# Patient Record
Sex: Female | Born: 1960 | Race: Black or African American | Hispanic: No | Marital: Single | Smoking: Former smoker
Health system: Southern US, Community
[De-identification: ages and names within clinical notes are randomized; demographics above are authoritative.]

## PROBLEM LIST (undated history)

## (undated) DIAGNOSIS — M51369 Other intervertebral disc degeneration, lumbar region without mention of lumbar back pain or lower extremity pain: Secondary | ICD-10-CM

## (undated) DIAGNOSIS — K219 Gastro-esophageal reflux disease without esophagitis: Secondary | ICD-10-CM

## (undated) DIAGNOSIS — G5603 Carpal tunnel syndrome, bilateral upper limbs: Secondary | ICD-10-CM

## (undated) DIAGNOSIS — E669 Obesity, unspecified: Secondary | ICD-10-CM

## (undated) DIAGNOSIS — I10 Essential (primary) hypertension: Secondary | ICD-10-CM

## (undated) DIAGNOSIS — M5030 Other cervical disc degeneration, unspecified cervical region: Secondary | ICD-10-CM

## (undated) DIAGNOSIS — E11319 Type 2 diabetes mellitus with unspecified diabetic retinopathy without macular edema: Secondary | ICD-10-CM

## (undated) DIAGNOSIS — J45909 Unspecified asthma, uncomplicated: Secondary | ICD-10-CM

## (undated) DIAGNOSIS — J302 Other seasonal allergic rhinitis: Secondary | ICD-10-CM

## (undated) DIAGNOSIS — F329 Major depressive disorder, single episode, unspecified: Secondary | ICD-10-CM

## (undated) DIAGNOSIS — Z972 Presence of dental prosthetic device (complete) (partial): Secondary | ICD-10-CM

## (undated) DIAGNOSIS — M170 Bilateral primary osteoarthritis of knee: Secondary | ICD-10-CM

## (undated) DIAGNOSIS — Z8619 Personal history of other infectious and parasitic diseases: Secondary | ICD-10-CM

## (undated) DIAGNOSIS — E785 Hyperlipidemia, unspecified: Secondary | ICD-10-CM

## (undated) DIAGNOSIS — M5136 Other intervertebral disc degeneration, lumbar region: Secondary | ICD-10-CM

## (undated) DIAGNOSIS — F32A Depression, unspecified: Secondary | ICD-10-CM

## (undated) HISTORY — PX: COLONOSCOPY: SHX174

---

## 1992-10-18 HISTORY — PX: NOSE SURGERY: SHX723

## 1999-10-19 HISTORY — PX: FLEXIBLE SIGMOIDOSCOPY: SHX1649

## 2004-10-29 ENCOUNTER — Ambulatory Visit: Payer: Self-pay | Admitting: Internal Medicine

## 2006-12-05 ENCOUNTER — Ambulatory Visit: Payer: Self-pay | Admitting: Family Medicine

## 2007-07-27 ENCOUNTER — Ambulatory Visit: Payer: Self-pay | Admitting: Nurse Practitioner

## 2007-08-15 ENCOUNTER — Encounter: Payer: Self-pay | Admitting: Unknown Physician Specialty

## 2007-08-19 ENCOUNTER — Encounter: Payer: Self-pay | Admitting: Unknown Physician Specialty

## 2007-09-18 ENCOUNTER — Encounter: Payer: Self-pay | Admitting: Unknown Physician Specialty

## 2007-12-14 ENCOUNTER — Ambulatory Visit: Payer: Self-pay | Admitting: Family Medicine

## 2008-12-31 ENCOUNTER — Ambulatory Visit: Payer: Self-pay | Admitting: Internal Medicine

## 2010-01-02 ENCOUNTER — Ambulatory Visit: Payer: Self-pay | Admitting: Internal Medicine

## 2011-01-28 ENCOUNTER — Ambulatory Visit: Payer: Self-pay | Admitting: Internal Medicine

## 2011-02-24 ENCOUNTER — Ambulatory Visit: Payer: Self-pay | Admitting: Internal Medicine

## 2012-02-01 ENCOUNTER — Ambulatory Visit: Payer: Self-pay | Admitting: Internal Medicine

## 2012-05-19 ENCOUNTER — Ambulatory Visit: Payer: Self-pay | Admitting: Unknown Physician Specialty

## 2013-02-02 ENCOUNTER — Ambulatory Visit: Payer: Self-pay | Admitting: Internal Medicine

## 2013-10-18 HISTORY — PX: OTHER SURGICAL HISTORY: SHX169

## 2014-02-14 ENCOUNTER — Ambulatory Visit: Payer: Self-pay | Admitting: Internal Medicine

## 2014-03-27 ENCOUNTER — Ambulatory Visit: Payer: Self-pay | Admitting: Family Medicine

## 2014-04-01 ENCOUNTER — Ambulatory Visit: Payer: Self-pay | Admitting: Physician Assistant

## 2014-05-10 ENCOUNTER — Ambulatory Visit: Payer: Self-pay | Admitting: Unknown Physician Specialty

## 2014-08-05 DIAGNOSIS — J302 Other seasonal allergic rhinitis: Secondary | ICD-10-CM | POA: Insufficient documentation

## 2014-08-05 DIAGNOSIS — I152 Hypertension secondary to endocrine disorders: Secondary | ICD-10-CM | POA: Insufficient documentation

## 2014-08-05 DIAGNOSIS — E11319 Type 2 diabetes mellitus with unspecified diabetic retinopathy without macular edema: Secondary | ICD-10-CM | POA: Insufficient documentation

## 2014-08-05 DIAGNOSIS — Z79899 Other long term (current) drug therapy: Secondary | ICD-10-CM | POA: Insufficient documentation

## 2014-08-05 DIAGNOSIS — E1169 Type 2 diabetes mellitus with other specified complication: Secondary | ICD-10-CM | POA: Insufficient documentation

## 2014-08-05 DIAGNOSIS — F3341 Major depressive disorder, recurrent, in partial remission: Secondary | ICD-10-CM | POA: Insufficient documentation

## 2014-08-05 DIAGNOSIS — K219 Gastro-esophageal reflux disease without esophagitis: Secondary | ICD-10-CM | POA: Insufficient documentation

## 2014-08-06 DIAGNOSIS — M170 Bilateral primary osteoarthritis of knee: Secondary | ICD-10-CM | POA: Insufficient documentation

## 2014-08-06 DIAGNOSIS — G4700 Insomnia, unspecified: Secondary | ICD-10-CM | POA: Insufficient documentation

## 2014-08-23 DIAGNOSIS — M5136 Other intervertebral disc degeneration, lumbar region: Secondary | ICD-10-CM | POA: Insufficient documentation

## 2014-08-23 DIAGNOSIS — M51369 Other intervertebral disc degeneration, lumbar region without mention of lumbar back pain or lower extremity pain: Secondary | ICD-10-CM | POA: Insufficient documentation

## 2014-12-04 DIAGNOSIS — G5603 Carpal tunnel syndrome, bilateral upper limbs: Secondary | ICD-10-CM | POA: Insufficient documentation

## 2015-02-20 ENCOUNTER — Other Ambulatory Visit: Payer: Self-pay | Admitting: Internal Medicine

## 2015-02-20 DIAGNOSIS — Z1239 Encounter for other screening for malignant neoplasm of breast: Secondary | ICD-10-CM

## 2015-02-25 ENCOUNTER — Ambulatory Visit: Admission: RE | Admit: 2015-02-25 | Payer: Self-pay | Source: Ambulatory Visit

## 2015-05-29 ENCOUNTER — Ambulatory Visit
Admission: RE | Admit: 2015-05-29 | Discharge: 2015-05-29 | Disposition: A | Discharge status: Home / Self Care | Payer: 59 | Source: Ambulatory Visit | Attending: Internal Medicine | Admitting: Internal Medicine

## 2015-05-29 DIAGNOSIS — Z1239 Encounter for other screening for malignant neoplasm of breast: Secondary | ICD-10-CM

## 2015-05-29 DIAGNOSIS — Z1231 Encounter for screening mammogram for malignant neoplasm of breast: Secondary | ICD-10-CM | POA: Insufficient documentation

## 2015-07-01 ENCOUNTER — Other Ambulatory Visit: Payer: Self-pay | Admitting: Orthopedic Surgery

## 2015-07-01 DIAGNOSIS — M5412 Radiculopathy, cervical region: Secondary | ICD-10-CM

## 2015-07-08 ENCOUNTER — Ambulatory Visit
Admission: RE | Admit: 2015-07-08 | Discharge: 2015-07-08 | Disposition: A | Discharge status: Home / Self Care | Payer: 59 | Source: Ambulatory Visit | Attending: Orthopedic Surgery | Admitting: Orthopedic Surgery

## 2015-07-08 DIAGNOSIS — M5412 Radiculopathy, cervical region: Secondary | ICD-10-CM | POA: Diagnosis present

## 2015-07-08 DIAGNOSIS — M2578 Osteophyte, vertebrae: Secondary | ICD-10-CM | POA: Diagnosis not present

## 2016-04-09 ENCOUNTER — Ambulatory Visit: Payer: 59 | Admitting: Anesthesiology

## 2016-04-09 ENCOUNTER — Encounter
Admission: RE | Disposition: A | Discharge status: Home / Self Care | Payer: Self-pay | Source: Ambulatory Visit | Attending: Unknown Physician Specialty

## 2016-04-09 ENCOUNTER — Ambulatory Visit
Admission: RE | Admit: 2016-04-09 | Discharge: 2016-04-09 | Disposition: A | Discharge status: Home / Self Care | Payer: 59 | Source: Ambulatory Visit | Attending: Unknown Physician Specialty | Admitting: Unknown Physician Specialty

## 2016-04-09 DIAGNOSIS — G5602 Carpal tunnel syndrome, left upper limb: Secondary | ICD-10-CM | POA: Insufficient documentation

## 2016-04-09 HISTORY — DX: Essential (primary) hypertension: I10

## 2016-04-09 HISTORY — DX: Personal history of other infectious and parasitic diseases: Z86.19

## 2016-04-09 HISTORY — DX: Other seasonal allergic rhinitis: J30.2

## 2016-04-09 HISTORY — DX: Type 2 diabetes mellitus with unspecified diabetic retinopathy without macular edema: E11.319

## 2016-04-09 HISTORY — DX: Depression, unspecified: F32.A

## 2016-04-09 HISTORY — DX: Presence of dental prosthetic device (complete) (partial): Z97.2

## 2016-04-09 HISTORY — DX: Bilateral primary osteoarthritis of knee: M17.0

## 2016-04-09 HISTORY — DX: Hyperlipidemia, unspecified: E78.5

## 2016-04-09 HISTORY — DX: Other intervertebral disc degeneration, lumbar region: M51.36

## 2016-04-09 HISTORY — DX: Other cervical disc degeneration, unspecified cervical region: M50.30

## 2016-04-09 HISTORY — PX: CARPAL TUNNEL RELEASE: SHX101

## 2016-04-09 HISTORY — DX: Obesity, unspecified: E66.9

## 2016-04-09 HISTORY — DX: Gastro-esophageal reflux disease without esophagitis: K21.9

## 2016-04-09 HISTORY — DX: Carpal tunnel syndrome, bilateral upper limbs: G56.03

## 2016-04-09 HISTORY — DX: Major depressive disorder, single episode, unspecified: F32.9

## 2016-04-09 HISTORY — DX: Other intervertebral disc degeneration, lumbar region without mention of lumbar back pain or lower extremity pain: M51.369

## 2016-04-09 LAB — GLUCOSE, CAPILLARY
Glucose-Capillary: 144 mg/dL — ABNORMAL HIGH (ref 65–99)
Glucose-Capillary: 134 mg/dL — ABNORMAL HIGH (ref 65–99)

## 2016-04-09 SURGERY — CARPAL TUNNEL RELEASE
Anesthesia: General | Site: Hand | Laterality: Left | Wound class: Clean

## 2016-04-09 MED ORDER — SCOPOLAMINE 1 MG/3DAYS TD PT72: 1.0000 | MEDICATED_PATCH | Freq: Once | TRANSDERMAL | Status: DC

## 2016-04-09 MED ORDER — FENTANYL CITRATE (PF) 100 MCG/2ML IJ SOLN
INTRAMUSCULAR | Status: DC | PRN
  Administered 2016-04-09: 100 ug via INTRAVENOUS

## 2016-04-09 MED ORDER — BUPIVACAINE HCL (PF) 0.5 % IJ SOLN
INTRAMUSCULAR | Status: DC | PRN
  Administered 2016-04-09: 7 mL

## 2016-04-09 MED ORDER — LACTATED RINGERS IV SOLN
INTRAVENOUS | Status: DC
  Administered 2016-04-09: 07:00:00 via INTRAVENOUS

## 2016-04-09 MED ORDER — PROPOFOL 10 MG/ML IV BOLUS
INTRAVENOUS | Status: DC | PRN
Start: 2016-04-09 — End: 2016-04-09
  Administered 2016-04-09: 130 mg via INTRAVENOUS

## 2016-04-09 MED ORDER — MIDAZOLAM HCL 5 MG/5ML IJ SOLN
INTRAMUSCULAR | Status: DC | PRN
  Administered 2016-04-09: 2 mg via INTRAVENOUS

## 2016-04-09 MED ORDER — FENTANYL CITRATE (PF) 100 MCG/2ML IJ SOLN: 25.0000 ug | INTRAMUSCULAR | Status: DC | PRN

## 2016-04-09 MED ORDER — NORCO 5-325 MG PO TABS: 1.0000 | ORAL_TABLET | Freq: Four times a day (QID) | ORAL | Status: DC | PRN

## 2016-04-09 MED ORDER — GLYCOPYRROLATE 0.2 MG/ML IJ SOLN
INTRAMUSCULAR | Status: DC | PRN
  Administered 2016-04-09: 0.1 mg via INTRAVENOUS

## 2016-04-09 MED ORDER — LIDOCAINE HCL (CARDIAC) 20 MG/ML IV SOLN
INTRAVENOUS | Status: DC | PRN
  Administered 2016-04-09: 40 mg via INTRATRACHEAL

## 2016-04-09 MED ORDER — ONDANSETRON HCL 4 MG/2ML IJ SOLN: 4.0000 mg | Freq: Once | INTRAMUSCULAR | Status: DC | PRN

## 2016-04-09 MED ORDER — DEXAMETHASONE SODIUM PHOSPHATE 4 MG/ML IJ SOLN
INTRAMUSCULAR | Status: DC | PRN
  Administered 2016-04-09: 4 mg via INTRAVENOUS

## 2016-04-09 MED ORDER — ONDANSETRON HCL 4 MG/2ML IJ SOLN
INTRAMUSCULAR | Status: DC | PRN
  Administered 2016-04-09: 4 mg via INTRAVENOUS

## 2016-04-09 SURGICAL SUPPLY — 30 items
BANDAGE ELASTIC 2 CLIP NS LF (GAUZE/BANDAGES/DRESSINGS) ×2 IMPLANT
BNDG ESMARK 4X12 TAN STRL LF (GAUZE/BANDAGES/DRESSINGS) ×2 IMPLANT
BNDG STRETCH 4X75 STRL LF (GAUZE/BANDAGES/DRESSINGS) ×2 IMPLANT
COVER LIGHT HANDLE FLEXIBLE (MISCELLANEOUS) ×4 IMPLANT
CUFF TOURN SGL QUICK 18 (TOURNIQUET CUFF) ×2 IMPLANT
DURAPREP 26ML APPLICATOR (WOUND CARE) ×2 IMPLANT
GAUZE SPONGE 4X4 12PLY STRL (GAUZE/BANDAGES/DRESSINGS) ×2 IMPLANT
GLOVE BIO SURGEON STRL SZ7.5 (GLOVE) ×4 IMPLANT
GLOVE BIO SURGEON STRL SZ8 (GLOVE) ×2 IMPLANT
GLOVE BIOGEL PI IND STRL 8 (GLOVE) ×1 IMPLANT
GLOVE BIOGEL PI INDICATOR 8 (GLOVE) ×1
GLOVE INDICATOR 8.0 STRL GRN (GLOVE) ×2 IMPLANT
GOWN STRL REUS W/ TWL LRG LVL3 (GOWN DISPOSABLE) ×2 IMPLANT
GOWN STRL REUS W/TWL LRG LVL3 (GOWN DISPOSABLE) ×2
KIT ROOM TURNOVER OR (KITS) ×2 IMPLANT
LOOP VESSEL RED MINI 1.3X0.9 (MISCELLANEOUS) IMPLANT
LOOPS RED MINI 1.3MMX0.9MM (MISCELLANEOUS)
NS IRRIG 500ML POUR BTL (IV SOLUTION) ×2 IMPLANT
PACK EXTREMITY ARMC (MISCELLANEOUS) ×2 IMPLANT
PAD GROUND ADULT SPLIT (MISCELLANEOUS) ×2 IMPLANT
PADDING CAST 2X4YD ST (MISCELLANEOUS) ×1
PADDING CAST BLEND 2X4 STRL (MISCELLANEOUS) ×1 IMPLANT
SOL PREP PVP 2OZ (MISCELLANEOUS) ×2
SOLUTION PREP PVP 2OZ (MISCELLANEOUS) ×1 IMPLANT
SPLINT CAST 1 STEP 3X12 (MISCELLANEOUS) ×2 IMPLANT
STOCKINETTE 4X48 STRL (DRAPES) ×2 IMPLANT
STRAP BODY AND KNEE 60X3 (MISCELLANEOUS) ×2 IMPLANT
SUT ETHILON 4-0 (SUTURE) ×2
SUT ETHILON 4-0 FS2 18XMFL BLK (SUTURE) ×2
SUTURE ETHLN 4-0 FS2 18XMF BLK (SUTURE) ×2 IMPLANT

## 2016-04-09 NOTE — Transfer of Care (Signed)
Immediate Anesthesia Transfer of Care Note  Patient: Debbie Powers  Procedure(s) Performed: Procedure(s): LEFT OPEN CARPAL TUNNEL RELEASE (Left)  Patient Location: PACU  Anesthesia Type: General LMA  Level of Consciousness: awake, alert  and patient cooperative  Airway and Oxygen Therapy: Patient Spontanous Breathing and Patient connected to supplemental oxygen  Post-op Assessment: Post-op Vital signs reviewed, Patient's Cardiovascular Status Stable, Respiratory Function Stable, Patent Airway and No signs of Nausea or vomiting  Post-op Vital Signs: Reviewed and stable  Complications: No apparent anesthesia complications

## 2016-04-09 NOTE — Anesthesia Postprocedure Evaluation (Signed)
Anesthesia Post Note  Patient: Debbie Powers  Procedure(s) Performed: Procedure(s) (LRB): LEFT OPEN CARPAL TUNNEL RELEASE (Left)  Patient location during evaluation: PACU Anesthesia Type: General Level of consciousness: awake and alert and oriented Pain management: satisfactory to patient Vital Signs Assessment: post-procedure vital signs reviewed and stable Respiratory status: spontaneous breathing, nonlabored ventilation and respiratory function stable Cardiovascular status: blood pressure returned to baseline and stable Postop Assessment: Adequate PO intake and No signs of nausea or vomiting Anesthetic complications: no    Raliegh Ip

## 2016-04-09 NOTE — Op Note (Signed)
DATE OF SURGERY:  04/09/2016  PATIENT NAME:  Debbie Powers   DOB: [birth year]  MRN: [id number]  PRE-OPERATIVE DIAGNOSIS: Left carpal tunnel syndrome  POST-OPERATIVE DIAGNOSIS:  Same  PROCEDURE: Left carpal tunnel release  SURGEON: Dr. Leanor Kail, [place] Bonito. M.D.  ANESTHESIA: Gen.   INDICATIONS FOR SURGERY: [name] is a 55 y.o. year old female with a long history of numbness and paresthesias in the left hand. Nerve conduction studies demonstrated findings consistent with moderately severe  median nerve compression.The patient had not seen any significant improvement despite conservative nonsurgical intervention. After discussion of the risks and benefits of surgical intervention, the patient expressed understanding of the risks benefits and agreed with plans for carpal tunnel release. Incidentally, the patient had an open carpal tunnel release on the right in the recent past.  PROCEDURE IN DETAIL: The patient was taken the operating room where satisfactory general anesthesia was achieved. A tourniquet was placed on the patient's left upper arm.The left hand and arm were prepped  and draped in the usual sterile fashion. A "time-out" was performed as per usual protocol. The hand and forearm were exsanguinated using an Esmarch and the tourniquet was inflated to 250 mmHg.  An incision was made just ulnar to the thenar palmar crease. Dissection was carried down through the palmar fascia to the transverse carpal ligament. The transverse carpal ligament was sharply incised, taking care to protect the underlying structures within the carpal tunnel. Complete release of the transverse carpal ligament was achieved. There was no evidence of a mass or proliferative synovitis within the carpal tunnel. The wound was irrigated with saline. The tourniquet was released at this time. It had been up for about 14 minutes. Bleeding was controlled with digital pressure and coagulation cautery. I did inject  the subcutaneous tissue of the wound with about 5 cc of 0.5% Marcaine without epinephrine. The skin was then re-approximated with interrupted sutures of #4-0 nylon. A sterile dressing was applied followed by application of a volar splint.  The patient was awakened and transferred to her stretcher bed.  The patient tolerated the procedure well and was transported to the PACU in stable condition. Blood loss was negligible.  Dr. Mariel Kansky. M.D.

## 2016-04-09 NOTE — Anesthesia Procedure Notes (Signed)
Procedure Name: LMA Insertion Date/Time: 04/09/2016 7:42 AM Performed by: Mayme Genta Pre-anesthesia Checklist: Patient identified, Emergency Drugs available, Suction available, Timeout performed and Patient being monitored Patient Re-evaluated:Patient Re-evaluated prior to inductionOxygen Delivery Method: Circle system utilized Preoxygenation: Pre-oxygenation with 100% oxygen Intubation Type: IV induction LMA: LMA inserted LMA Size: 4.0 Number of attempts: 1 Placement Confirmation: positive ETCO2 and breath sounds checked- equal and bilateral Tube secured with: Tape

## 2016-04-09 NOTE — Anesthesia Preprocedure Evaluation (Signed)
Anesthesia Evaluation  Patient identified by MRN, date of birth, ID band  Reviewed: Allergy & Precautions, H&P , NPO status , Patient's Chart, lab work & pertinent test results  Airway Mallampati: II  TM Distance: >3 FB Neck ROM: full    Dental  (+) Partial Upper   Pulmonary former smoker,    Pulmonary exam normal        Cardiovascular hypertension,  Rhythm:regular Rate:Normal     Neuro/Psych    GI/Hepatic GERD  ,  Endo/Other  diabetes  Renal/GU      Musculoskeletal   Abdominal   Peds  Hematology   Anesthesia Other Findings   Reproductive/Obstetrics                             Anesthesia Physical Anesthesia Plan  ASA: II  Anesthesia Plan: General LMA   Post-op Pain Management:    Induction:   Airway Management Planned:   Additional Equipment:   Intra-op Plan:   Post-operative Plan:   Informed Consent: I have reviewed the patients History and Physical, chart, labs and discussed the procedure including the risks, benefits and alternatives for the proposed anesthesia with the patient or authorized representative who has indicated his/her understanding and acceptance.     Plan Discussed with: CRNA  Anesthesia Plan Comments:         Anesthesia Quick Evaluation

## 2016-04-09 NOTE — Discharge Instructions (Signed)
General Anesthesia, Adult, Care After Refer to this sheet in the next few weeks. These instructions provide you with information on caring for yourself after your procedure. Your health care provider may also give you more specific instructions. Your treatment has been planned according to current medical practices, but problems sometimes occur. Call your health care provider if you have any problems or questions after your procedure. WHAT TO EXPECT AFTER THE PROCEDURE After the procedure, it is typical to experience:  Sleepiness.  Nausea and vomiting. HOME CARE INSTRUCTIONS  For the first 24 hours after general anesthesia:  Have a responsible person with you.  Do not drive a car. If you are alone, do not take public transportation.  Do not drink alcohol.  Do not take medicine that has not been prescribed by your health care provider.  Do not sign important papers or make important decisions.  You may resume a normal diet and activities as directed by your health care provider.  Change bandages (dressings) as directed.  If you have questions or problems that seem related to general anesthesia, call the hospital and ask for the anesthetist or anesthesiologist on call. SEEK MEDICAL CARE IF:  You have nausea and vomiting that continue the day after anesthesia.  You develop a rash. SEEK IMMEDIATE MEDICAL CARE IF:   You have difficulty breathing.  You have chest pain.  You have any allergic problems.   This information is not intended to replace advice given to you by your health care provider. Make sure you discuss any questions you have with your health care provider.   Document Released: 01/10/2001 Document Revised: 10/25/2014 Document Reviewed: 02/02/2012 Elsevier Interactive Patient Education 2016 [employer].  Elevation   Ice pack  RTC in about 14 days

## 2016-04-09 NOTE — H&P (Signed)
  H and P reviewed. No changes. Uploaded at later date. 

## 2016-04-12 ENCOUNTER — Encounter: Payer: Self-pay | Admitting: Unknown Physician Specialty

## 2016-08-12 ENCOUNTER — Other Ambulatory Visit: Payer: Self-pay | Admitting: Internal Medicine

## 2016-08-12 DIAGNOSIS — Z780 Asymptomatic menopausal state: Secondary | ICD-10-CM | POA: Insufficient documentation

## 2016-08-12 DIAGNOSIS — Z1231 Encounter for screening mammogram for malignant neoplasm of breast: Secondary | ICD-10-CM

## 2016-10-25 DIAGNOSIS — I10 Essential (primary) hypertension: Secondary | ICD-10-CM | POA: Diagnosis not present

## 2016-10-25 DIAGNOSIS — R05 Cough: Secondary | ICD-10-CM | POA: Diagnosis not present

## 2016-10-25 DIAGNOSIS — J40 Bronchitis, not specified as acute or chronic: Secondary | ICD-10-CM | POA: Diagnosis not present

## 2016-10-28 DIAGNOSIS — M7551 Bursitis of right shoulder: Secondary | ICD-10-CM | POA: Diagnosis not present

## 2017-03-01 DIAGNOSIS — M7061 Trochanteric bursitis, right hip: Secondary | ICD-10-CM | POA: Insufficient documentation

## 2017-03-01 DIAGNOSIS — Z79899 Other long term (current) drug therapy: Secondary | ICD-10-CM | POA: Diagnosis not present

## 2017-03-01 DIAGNOSIS — M7062 Trochanteric bursitis, left hip: Secondary | ICD-10-CM | POA: Insufficient documentation

## 2017-03-01 DIAGNOSIS — M75101 Unspecified rotator cuff tear or rupture of right shoulder, not specified as traumatic: Secondary | ICD-10-CM | POA: Insufficient documentation

## 2017-03-01 DIAGNOSIS — E11319 Type 2 diabetes mellitus with unspecified diabetic retinopathy without macular edema: Secondary | ICD-10-CM | POA: Diagnosis not present

## 2017-03-01 DIAGNOSIS — E782 Mixed hyperlipidemia: Secondary | ICD-10-CM | POA: Diagnosis not present

## 2017-03-08 DIAGNOSIS — M7551 Bursitis of right shoulder: Secondary | ICD-10-CM | POA: Diagnosis not present

## 2017-03-08 DIAGNOSIS — E11319 Type 2 diabetes mellitus with unspecified diabetic retinopathy without macular edema: Secondary | ICD-10-CM | POA: Diagnosis not present

## 2017-03-08 DIAGNOSIS — M5440 Lumbago with sciatica, unspecified side: Secondary | ICD-10-CM | POA: Diagnosis not present

## 2017-03-28 DIAGNOSIS — M7661 Achilles tendinitis, right leg: Secondary | ICD-10-CM | POA: Diagnosis not present

## 2017-03-29 DIAGNOSIS — M5136 Other intervertebral disc degeneration, lumbar region: Secondary | ICD-10-CM | POA: Diagnosis not present

## 2017-03-29 DIAGNOSIS — M6281 Muscle weakness (generalized): Secondary | ICD-10-CM | POA: Diagnosis not present

## 2017-03-31 DIAGNOSIS — M6281 Muscle weakness (generalized): Secondary | ICD-10-CM | POA: Diagnosis not present

## 2017-03-31 DIAGNOSIS — M5136 Other intervertebral disc degeneration, lumbar region: Secondary | ICD-10-CM | POA: Diagnosis not present

## 2017-04-05 DIAGNOSIS — M5136 Other intervertebral disc degeneration, lumbar region: Secondary | ICD-10-CM | POA: Diagnosis not present

## 2017-04-05 DIAGNOSIS — M6281 Muscle weakness (generalized): Secondary | ICD-10-CM | POA: Diagnosis not present

## 2017-04-07 DIAGNOSIS — M5136 Other intervertebral disc degeneration, lumbar region: Secondary | ICD-10-CM | POA: Diagnosis not present

## 2017-04-07 DIAGNOSIS — M5030 Other cervical disc degeneration, unspecified cervical region: Secondary | ICD-10-CM | POA: Diagnosis not present

## 2017-04-07 DIAGNOSIS — M6281 Muscle weakness (generalized): Secondary | ICD-10-CM | POA: Diagnosis not present

## 2017-04-11 DIAGNOSIS — M7661 Achilles tendinitis, right leg: Secondary | ICD-10-CM | POA: Diagnosis not present

## 2017-04-12 DIAGNOSIS — M7661 Achilles tendinitis, right leg: Secondary | ICD-10-CM | POA: Diagnosis not present

## 2017-04-12 DIAGNOSIS — M6281 Muscle weakness (generalized): Secondary | ICD-10-CM | POA: Diagnosis not present

## 2017-04-14 DIAGNOSIS — M6281 Muscle weakness (generalized): Secondary | ICD-10-CM | POA: Diagnosis not present

## 2017-04-14 DIAGNOSIS — M5030 Other cervical disc degeneration, unspecified cervical region: Secondary | ICD-10-CM | POA: Diagnosis not present

## 2017-04-26 DIAGNOSIS — M5136 Other intervertebral disc degeneration, lumbar region: Secondary | ICD-10-CM | POA: Diagnosis not present

## 2017-04-26 DIAGNOSIS — M7661 Achilles tendinitis, right leg: Secondary | ICD-10-CM | POA: Diagnosis not present

## 2017-04-26 DIAGNOSIS — M6281 Muscle weakness (generalized): Secondary | ICD-10-CM | POA: Diagnosis not present

## 2017-04-28 DIAGNOSIS — M7661 Achilles tendinitis, right leg: Secondary | ICD-10-CM | POA: Diagnosis not present

## 2017-04-28 DIAGNOSIS — M6281 Muscle weakness (generalized): Secondary | ICD-10-CM | POA: Diagnosis not present

## 2017-05-03 ENCOUNTER — Other Ambulatory Visit: Payer: Self-pay | Admitting: Physical Medicine and Rehabilitation

## 2017-05-03 DIAGNOSIS — M5416 Radiculopathy, lumbar region: Secondary | ICD-10-CM

## 2017-05-03 DIAGNOSIS — M5136 Other intervertebral disc degeneration, lumbar region: Secondary | ICD-10-CM | POA: Diagnosis not present

## 2017-05-09 DIAGNOSIS — M7661 Achilles tendinitis, right leg: Secondary | ICD-10-CM | POA: Diagnosis not present

## 2017-05-11 ENCOUNTER — Ambulatory Visit
Admission: RE | Admit: 2017-05-11 | Discharge: 2017-05-11 | Disposition: A | Discharge status: Home / Self Care | Payer: 59 | Source: Ambulatory Visit | Attending: Physical Medicine and Rehabilitation | Admitting: Physical Medicine and Rehabilitation

## 2017-05-11 DIAGNOSIS — M5136 Other intervertebral disc degeneration, lumbar region: Secondary | ICD-10-CM | POA: Insufficient documentation

## 2017-05-11 DIAGNOSIS — M47896 Other spondylosis, lumbar region: Secondary | ICD-10-CM | POA: Insufficient documentation

## 2017-05-11 DIAGNOSIS — M545 Low back pain: Secondary | ICD-10-CM | POA: Diagnosis not present

## 2017-05-11 DIAGNOSIS — M5416 Radiculopathy, lumbar region: Secondary | ICD-10-CM

## 2017-05-11 DIAGNOSIS — M48061 Spinal stenosis, lumbar region without neurogenic claudication: Secondary | ICD-10-CM | POA: Insufficient documentation

## 2017-05-11 DIAGNOSIS — M5116 Intervertebral disc disorders with radiculopathy, lumbar region: Secondary | ICD-10-CM | POA: Diagnosis not present

## 2017-05-31 DIAGNOSIS — M5136 Other intervertebral disc degeneration, lumbar region: Secondary | ICD-10-CM | POA: Diagnosis not present

## 2017-05-31 DIAGNOSIS — M5416 Radiculopathy, lumbar region: Secondary | ICD-10-CM | POA: Diagnosis not present

## 2017-06-15 ENCOUNTER — Ambulatory Visit
Admission: EM | Admit: 2017-06-15 | Discharge: 2017-06-15 | Disposition: A | Discharge status: Other Acute Inpatient Hospital | Payer: 59 | Attending: Emergency Medicine | Admitting: Emergency Medicine

## 2017-06-15 ENCOUNTER — Encounter: Payer: Self-pay | Admitting: *Deleted

## 2017-06-15 DIAGNOSIS — M25512 Pain in left shoulder: Secondary | ICD-10-CM | POA: Diagnosis not present

## 2017-06-15 DIAGNOSIS — F329 Major depressive disorder, single episode, unspecified: Secondary | ICD-10-CM | POA: Insufficient documentation

## 2017-06-15 DIAGNOSIS — Z87891 Personal history of nicotine dependence: Secondary | ICD-10-CM | POA: Insufficient documentation

## 2017-06-15 DIAGNOSIS — I10 Essential (primary) hypertension: Secondary | ICD-10-CM | POA: Diagnosis not present

## 2017-06-15 DIAGNOSIS — R079 Chest pain, unspecified: Secondary | ICD-10-CM | POA: Diagnosis not present

## 2017-06-15 DIAGNOSIS — M79602 Pain in left arm: Secondary | ICD-10-CM | POA: Diagnosis not present

## 2017-06-15 DIAGNOSIS — E11319 Type 2 diabetes mellitus with unspecified diabetic retinopathy without macular edema: Secondary | ICD-10-CM | POA: Insufficient documentation

## 2017-06-15 DIAGNOSIS — K219 Gastro-esophageal reflux disease without esophagitis: Secondary | ICD-10-CM | POA: Insufficient documentation

## 2017-06-15 DIAGNOSIS — Z9889 Other specified postprocedural states: Secondary | ICD-10-CM | POA: Insufficient documentation

## 2017-06-15 MED ORDER — ASPIRIN 81 MG PO CHEW
324.0000 mg | CHEWABLE_TABLET | Freq: Once | ORAL | Status: AC
  Administered 2017-06-15: 243 mg via ORAL

## 2017-06-15 NOTE — ED Triage Notes (Signed)
PAtient started having symptom of left sided chest pain radiating down her left arm this AM 0300.

## 2017-06-15 NOTE — ED Provider Notes (Signed)
HPI  SUBJECTIVE:  Debbie Powers is a 56 y.o. female who presents with the acute onset of  Left sharp shoulder pain radiating down her left arm. 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 It is intermittent, lasting seconds. She states it is stabbing in her shoulder and back and squeezing and sharp in her arm. She denies chest pressure, heaviness, diaphoresis. There is no exertional component. It does not seem to be associated with shoulder range of motion, pushing or pulling. No tearing sensation. No palpitations, shortness of breath, lightheadedness, dizziness, syncope, coughing, wheezing, abdominal pain. She has never had symptoms like this before. She tried aspirin 81 mg without improvement in her symptoms. No aggravating factors. Past medical history of obesity, diabetes, hypertension, hypercholesterolemia. no history of MI, smoking, HIV, cocaine use. Family history significant for fatal MI brother age 91, fatal MI mother age 27, MI dad 71, MI sister age 37. PMD: Ezequiel Kayser, MD   Past Medical History:  Diagnosis Date  . Bilateral carpal tunnel syndrome   . Degenerative disc disease, cervical   . Degenerative disc disease, lumbar   . Depression   . Diabetes mellitus type 2 with retinopathy ([place])   . GERD (gastroesophageal reflux disease)   . History of herpes genitalis   . Hyperlipidemia   . Hypertension   . Obesity   . Osteoarthritis of both knees   . Seasonal rhinitis   . Wears dentures     Past Surgical History:  Procedure Laterality Date  . CARPAL TUNNEL RELEASE Left 04/09/2016   Procedure: LEFT OPEN CARPAL TUNNEL RELEASE;  Surgeon: Leanor Kail, MD;  Location: [facility];  Service: Orthopedics;  Laterality: Left;  . COLONOSCOPY    . FLEXIBLE SIGMOIDOSCOPY  2001  . NOSE SURGERY  1994  . open carpal tunnel release Right 2015    History reviewed. No pertinent family history.  Social History  Substance Use Topics  .  Smoking status: Former Research scientist (life sciences)  . Smokeless tobacco: Never Used  . Alcohol use No    No current facility-administered medications for this encounter.   Current Outpatient Prescriptions:  .  benazepril (LOTENSIN) 40 MG tablet, Take 40 mg by mouth daily., Disp: , Rfl:  .  citalopram (CELEXA) 40 MG tablet, Take 40 mg by mouth daily., Disp: , Rfl:  .  cyclobenzaprine (FLEXERIL) 10 MG tablet, Take 10 mg by mouth 3 (three) times daily as needed for muscle spasms., Disp: , Rfl:  .  estradiol (ESTRACE) 0.5 MG tablet, Take 0.5 mg by mouth daily., Disp: , Rfl:  .  etodolac (LODINE) 400 MG tablet, Take 400 mg by mouth 2 (two) times daily., Disp: , Rfl:  .  gabapentin (NEURONTIN) 300 MG capsule, Take 300-600 mg by mouth at bedtime as needed., Disp: , Rfl:  .  glimepiride (AMARYL) 2 MG tablet, Take 2 mg by mouth daily with breakfast., Disp: , Rfl:  .  ibuprofen (ADVIL,MOTRIN) 200 MG tablet, Take 400-800 mg by mouth every 8 (eight) hours as needed., Disp: , Rfl:  .  medroxyPROGESTERone (PROVERA) 2.5 MG tablet, Take 2.5 mg by mouth daily., Disp: , Rfl:  .  metFORMIN (GLUCOPHAGE-XR) 500 MG 24 hr tablet, Take 1,000 mg by mouth 2 (two) times daily with a meal., Disp: , Rfl:  .  omeprazole (PRILOSEC OTC) 20 MG tablet, Take 20 mg by mouth daily., Disp: , Rfl:  .  potassium chloride SA (K-DUR,KLOR-CON) 20 MEQ tablet, Take 20 mEq  by mouth 3 (three) times daily., Disp: , Rfl:  .  triamterene-hydrochlorothiazide (MAXZIDE-25) 37.5-25 MG tablet, Take 1 tablet by mouth daily., Disp: , Rfl:  .  zolpidem (AMBIEN) 10 MG tablet, Take 10 mg by mouth at bedtime as needed for sleep., Disp: , Rfl:   No Known Allergies   ROS  As noted in HPI.   Physical Exam  BP 136/80 (BP Location: Left Arm)   Pulse 85   Temp 98.2 F (36.8 C) (Oral)   Resp 18   Ht 5\' 6"  (1.676 m)   Wt 235 lb (106.6 kg)   SpO2 100%   BMI 37.93 kg/m   Constitutional: Well developed, well nourished, no acute distress Eyes:  EOMI, conjunctiva  normal bilaterally HENT: Normocephalic, atraumatic,mucus membranes moist Respiratory: Normal inspiratory effort lungs clear b/l Cardiovascular: Normal rate regular rhythm no murmurs rubs or gallops GI: nondistended skin: No rash, skin intact Musculoskeletal: no deformities. Positive tenderness along the trapezius, positive tenderness of the biceps tendon, neg empty can test, L shoulder  ROM normal, Drop test normal,  clavicle NT , A/C joint NT, scapula NT  proximal humerus NT , shoulder joint NT, Motor strength normal  Sensation intact LT over deltoid region, distal NVI with hand on L side having grossly intact sensation and strength.  no pain with internal rotation, no pain with external rotation, negative liftoff test, no instability with abduction/external rotation. Neurologic: Alert & oriented x 3, no focal neuro deficits Psychiatric: Speech and behavior appropriate   ED Course   Medications  aspirin chewable tablet 324 mg (243 mg Oral Given 06/15/17 1426)    Orders Placed This Encounter  Procedures  . EKG 12-Lead    Standing Status:   Standing    Number of Occurrences:   1    No results found for this or any previous visit (from the past 24 hour(s)). No results found.  ED Clinical Impression  Acute pain of left shoulder   ED Assessment/Plan  EKG: Normal sinus rhythm, rate 78, normal axis, normal intervals. No hypertrophy. No previous EKG for comparison.   While she does have some muscular tenderness, unable to reproduce the pain that brings her in. It does not seem to be associated with any particular movement. Given her comorbidities and significant family history, concern for atypical chest pain. We'll give aspirin 324 mg by mouth, and transferred to the [facility] ED. Offered to call EMS multiple times, patient refused and states that she will drive herself.  Discussed  MDM, plan and followup with patient. Patient agrees with plan.   Meds ordered this encounter   Medications  . aspirin chewable tablet 324 mg    *This clinic note was created using [employer]. Therefore, there may be occasional mistakes despite careful proofreading.  ?   Melynda Ripple, MD 06/15/17 1500

## 2017-06-15 NOTE — Discharge Instructions (Signed)
This may be musculoskeletal, I am concerned that this could also be your heart. Go immediately to the [facility] emergency department. You need a comprehensive evaluation.

## 2017-06-16 ENCOUNTER — Other Ambulatory Visit: Payer: Self-pay | Admitting: Internal Medicine

## 2017-06-16 DIAGNOSIS — Z124 Encounter for screening for malignant neoplasm of cervix: Secondary | ICD-10-CM | POA: Diagnosis not present

## 2017-06-16 DIAGNOSIS — Z0000 Encounter for general adult medical examination without abnormal findings: Secondary | ICD-10-CM | POA: Diagnosis not present

## 2017-06-16 DIAGNOSIS — M25512 Pain in left shoulder: Secondary | ICD-10-CM | POA: Diagnosis not present

## 2017-06-16 DIAGNOSIS — R05 Cough: Secondary | ICD-10-CM | POA: Diagnosis not present

## 2017-06-16 DIAGNOSIS — M7661 Achilles tendinitis, right leg: Secondary | ICD-10-CM | POA: Insufficient documentation

## 2017-06-16 DIAGNOSIS — Z1231 Encounter for screening mammogram for malignant neoplasm of breast: Secondary | ICD-10-CM

## 2017-06-27 DIAGNOSIS — M25512 Pain in left shoulder: Secondary | ICD-10-CM | POA: Diagnosis not present

## 2017-06-27 DIAGNOSIS — M19012 Primary osteoarthritis, left shoulder: Secondary | ICD-10-CM | POA: Diagnosis not present

## 2017-06-27 DIAGNOSIS — E11319 Type 2 diabetes mellitus with unspecified diabetic retinopathy without macular edema: Secondary | ICD-10-CM | POA: Diagnosis not present

## 2017-08-11 DIAGNOSIS — E11319 Type 2 diabetes mellitus with unspecified diabetic retinopathy without macular edema: Secondary | ICD-10-CM | POA: Diagnosis not present

## 2017-08-11 DIAGNOSIS — Z1159 Encounter for screening for other viral diseases: Secondary | ICD-10-CM | POA: Diagnosis not present

## 2017-08-11 DIAGNOSIS — Z79899 Other long term (current) drug therapy: Secondary | ICD-10-CM | POA: Diagnosis not present

## 2017-08-18 DIAGNOSIS — R05 Cough: Secondary | ICD-10-CM | POA: Diagnosis not present

## 2017-08-18 DIAGNOSIS — J441 Chronic obstructive pulmonary disease with (acute) exacerbation: Secondary | ICD-10-CM | POA: Diagnosis not present

## 2017-09-27 DIAGNOSIS — Z23 Encounter for immunization: Secondary | ICD-10-CM | POA: Diagnosis not present

## 2017-10-24 DIAGNOSIS — J449 Chronic obstructive pulmonary disease, unspecified: Secondary | ICD-10-CM | POA: Diagnosis not present

## 2017-10-24 DIAGNOSIS — R000 Tachycardia, unspecified: Secondary | ICD-10-CM | POA: Diagnosis not present

## 2017-10-24 DIAGNOSIS — R0602 Shortness of breath: Secondary | ICD-10-CM | POA: Diagnosis not present

## 2017-10-25 ENCOUNTER — Other Ambulatory Visit: Payer: Self-pay | Admitting: Specialist

## 2017-10-25 DIAGNOSIS — R911 Solitary pulmonary nodule: Secondary | ICD-10-CM

## 2017-11-03 ENCOUNTER — Ambulatory Visit
Admission: RE | Admit: 2017-11-03 | Discharge: 2017-11-03 | Disposition: A | Discharge status: Home / Self Care | Payer: 59 | Source: Ambulatory Visit | Attending: Specialist | Admitting: Specialist

## 2017-11-03 DIAGNOSIS — R911 Solitary pulmonary nodule: Secondary | ICD-10-CM | POA: Diagnosis not present

## 2017-11-21 DIAGNOSIS — R05 Cough: Secondary | ICD-10-CM | POA: Diagnosis not present

## 2017-11-21 DIAGNOSIS — J310 Chronic rhinitis: Secondary | ICD-10-CM | POA: Diagnosis not present

## 2017-11-21 DIAGNOSIS — R0609 Other forms of dyspnea: Secondary | ICD-10-CM | POA: Diagnosis not present

## 2017-12-15 ENCOUNTER — Other Ambulatory Visit: Payer: Self-pay

## 2017-12-15 ENCOUNTER — Ambulatory Visit
Admission: EM | Admit: 2017-12-15 | Discharge: 2017-12-15 | Disposition: A | Discharge status: Home / Self Care | Payer: 59 | Attending: Family Medicine | Admitting: Family Medicine

## 2017-12-15 ENCOUNTER — Encounter: Payer: Self-pay | Admitting: Emergency Medicine

## 2017-12-15 DIAGNOSIS — L0291 Cutaneous abscess, unspecified: Secondary | ICD-10-CM | POA: Diagnosis not present

## 2017-12-15 DIAGNOSIS — L02211 Cutaneous abscess of abdominal wall: Secondary | ICD-10-CM | POA: Diagnosis not present

## 2017-12-15 MED ORDER — DOXYCYCLINE HYCLATE 100 MG PO CAPS: 100.0000 mg | ORAL_CAPSULE | Freq: Two times a day (BID) | ORAL | 0 refills | Status: DC

## 2017-12-15 MED ORDER — CEFTRIAXONE SODIUM 1 G IJ SOLR
1.0000 g | Freq: Once | INTRAMUSCULAR | Status: AC
  Administered 2017-12-15: 1 g via INTRAMUSCULAR

## 2017-12-15 MED ORDER — MUPIROCIN 2 % EX OINT: 1.0000 "application " | TOPICAL_OINTMENT | Freq: Three times a day (TID) | CUTANEOUS | 0 refills | Status: DC

## 2017-12-15 NOTE — ED Provider Notes (Signed)
[facility]    CSN: [id number] Arrival date & time: 12/15/17  9735     History   Chief Complaint Chief Complaint  Patient presents with  . Abscess    HPI Debbie Powers is a 57 y.o. female.   HPI  57 year old female presents with a one-week history of an abscess on her lower abdomen.  She has been using warm compresses but not aggressively.  Become more tender and a  small pustule has appeared.  There is another area just inferior to pustule that has an orange peel skin and is very soft not exactly fluctuant most likely due to fat; patient is obese.  She has had no fever or chills.       Past Medical History:  Diagnosis Date  . Bilateral carpal tunnel syndrome   . Degenerative disc disease, cervical   . Degenerative disc disease, lumbar   . Depression   . Diabetes mellitus type 2 with retinopathy ([place])   . GERD (gastroesophageal reflux disease)   . History of herpes genitalis   . Hyperlipidemia   . Hypertension   . Obesity   . Osteoarthritis of both knees   . Seasonal rhinitis   . Wears dentures     There are no active problems to display for this patient.   Past Surgical History:  Procedure Laterality Date  . CARPAL TUNNEL RELEASE Left 04/09/2016   Procedure: LEFT OPEN CARPAL TUNNEL RELEASE;  Surgeon: Leanor Kail, MD;  Location: [facility];  Service: Orthopedics;  Laterality: Left;  . COLONOSCOPY    . FLEXIBLE SIGMOIDOSCOPY  2001  . NOSE SURGERY  1994  . open carpal tunnel release Right 2015    OB History    No data available       Home Medications    Prior to Admission medications   Medication Sig Start Date End Date Taking? Authorizing Provider  benazepril (LOTENSIN) 40 MG tablet Take 40 mg by mouth daily.   Yes [provider]  citalopram (CELEXA) 40 MG tablet Take 40 mg by mouth daily.   Yes [provider]  cyclobenzaprine (FLEXERIL) 10 MG tablet Take 10 mg by mouth 3 (three) times daily as  needed for muscle spasms.   Yes [provider]  estradiol (ESTRACE) 0.5 MG tablet Take 0.5 mg by mouth daily.   Yes [provider]  etodolac (LODINE) 400 MG tablet Take 400 mg by mouth 2 (two) times daily.   Yes [provider]  gabapentin (NEURONTIN) 300 MG capsule Take 300-600 mg by mouth at bedtime as needed.   Yes [provider]  glimepiride (AMARYL) 2 MG tablet Take 2 mg by mouth daily with breakfast.   Yes [provider]  ibuprofen (ADVIL,MOTRIN) 200 MG tablet Take 400-800 mg by mouth every 8 (eight) hours as needed.   Yes [provider]  medroxyPROGESTERone (PROVERA) 2.5 MG tablet Take 2.5 mg by mouth daily.   Yes [provider]  metFORMIN (GLUCOPHAGE-XR) 500 MG 24 hr tablet Take 1,000 mg by mouth 2 (two) times daily with a meal.   Yes [provider]  omeprazole (PRILOSEC OTC) 20 MG tablet Take 20 mg by mouth daily.   Yes [provider]  potassium chloride SA (K-DUR,KLOR-CON) 20 MEQ tablet Take 20 mEq by mouth 3 (three) times daily.   Yes [provider]  triamterene-hydrochlorothiazide (MAXZIDE-25) 37.5-25 MG tablet Take 1 tablet by mouth daily.   Yes [provider]  zolpidem Lorrin Mais)  10 MG tablet Take 10 mg by mouth at bedtime as needed for sleep.   Yes [provider]  doxycycline (VIBRAMYCIN) 100 MG capsule Take 1 capsule (100 mg total) by mouth 2 (two) times daily. 12/15/17   Lorin Picket, PA-C  mupirocin ointment (BACTROBAN) 2 % Apply 1 application topically 3 (three) times daily. 12/15/17   Lorin Picket, PA-C    Family History Family History  Problem Relation Age of Onset  . Heart attack Mother   . Diabetes Mother   . Hypertension Mother   . Stroke Father   . Hypertension Father   . Diabetes Father     Social History Social History   Tobacco Use  . Smoking status: Former Smoker    Last attempt to quit: 1997    Years since quitting: 22.1  .  Smokeless tobacco: Never Used  Substance Use Topics  . Alcohol use: No  . Drug use: No     Allergies   Patient has no known allergies.   Review of Systems Review of Systems  Constitutional: Positive for activity change. Negative for appetite change, chills, fatigue and fever.  Skin: Positive for wound.  All other systems reviewed and are negative.    Physical Exam Triage Vital Signs ED Triage Vitals  Enc Vitals Group     BP 12/15/17 0840 (!) 154/85     Pulse Rate 12/15/17 0840 (!) 103     Resp 12/15/17 0840 16     Temp 12/15/17 0840 99.2 F (37.3 C)     Temp Source 12/15/17 0840 Oral     SpO2 12/15/17 0840 98 %     Weight 12/15/17 0839 236 lb (107 kg)     Height 12/15/17 0839 5\' 6"  (1.676 m)     Head Circumference --      Peak Flow --      Pain Score 12/15/17 0839 10     Pain Loc --      Pain Edu? --      Excl. in [place]? --    No data found.  Updated Vital Signs BP (!) 154/85 (BP Location: Left Arm)   Pulse (!) 103   Temp 99.2 F (37.3 C) (Oral)   Resp 16   Ht 5\' 6"  (1.676 m)   Wt 236 lb (107 kg)   SpO2 98%   BMI 38.09 kg/m   Visual Acuity Right Eye Distance:   Left Eye Distance:   Bilateral Distance:    Right Eye Near:   Left Eye Near:    Bilateral Near:     Physical Exam  Constitutional: She is oriented to person, place, and time. She appears well-developed and well-nourished. No distress.  HENT:  Head: Normocephalic.  Eyes: Pupils are equal, round, and reactive to light. Right eye exhibits no discharge. Left eye exhibits no discharge.  Neck: Normal range of motion. No thyromegaly present.  Abdominal: Soft. Bowel sounds are normal. She exhibits mass.  Examination of the abdomen was performed with Nevin Bloodgood, CMA as assistant chaperone.  Inferior to the umbilicus and slightly left of midline is a area of induration with a small pustule.  There is a mild amount of fluctuant presents.  The induration extends approximately 2 cm from the pustule mostly  leftward laterally.  Inferior to this area is a patch of orange peel type skin that is very soft not exactly fluctuant mostly from adipose.  There is no induration in this area.  There is no redness but  there is warmth present.  It is tender to palpation really over the more superior lesion that has the induration present and refer to photographs for post I&D documentation  Musculoskeletal: Normal range of motion.  Neurological: She is alert and oriented to person, place, and time.  Skin: Skin is warm and dry. She is not diaphoretic.  Psychiatric: She has a normal mood and affect. Her behavior is normal. Judgment and thought content normal.  Nursing note and vitals reviewed.        UC Treatments / Results  Labs (all labs ordered are listed, but only abnormal results are displayed) Labs Reviewed  AEROBIC CULTURE (SUPERFICIAL SPECIMEN)    EKG  EKG Interpretation None       Radiology No results found.  Procedures Incision and Drainage Date/Time: 12/15/2017 9:58 AM Performed by: Lorin Picket, PA-C Authorized by: [name], DO   Consent:    Consent obtained:  Verbal   Consent given by:  Patient   Risks discussed:  Bleeding, incomplete drainage and infection   Alternatives discussed:  Referral Location:    Type:  Abscess   Location:  Trunk   Trunk location:  Abdomen Pre-procedure details:    Skin preparation:  Chloraprep Anesthesia (see MAR for exact dosages):    Anesthesia method:  Local infiltration   Local anesthetic:  Lidocaine 1% WITH epi Procedure type:    Complexity:  Complex Procedure details:    Needle aspiration: yes     Needle size:  18 G   Incision types:  Single straight   Incision depth:  Subcutaneous   Scalpel blade:  11   Wound management:  Probed and deloculated   Drainage:  Purulent and bloody   Drainage amount:  Copious   Wound treatment:  Drain placed   Packing materials:  1/4 in gauze   Amount 1/4":  24 inches Post-procedure  details:    Patient tolerance of procedure:  Tolerated well, no immediate complications Comments:     Addition to above the inferior most orange peel area was infiltrated with 1% lidocaine with epi.  After good anesthesia 18-gauge needle was used for aspiration.  No purulence was returned.  She will keep the area dry for 24 hours.  She should return to our clinic tomorrow for dressing change since there was a copious amount of drainage present during the dressing of the wound.  ABD pads were utilized to absorb most of the drainage that is anticipated.  If it is worse the patient will come back to our clinic, if we are open; if not she will go to the emergency room.  Told the patient that there is a likelihood that she may require more surgical involvement then we were able to perform perform in the clinic and she may need referral to the surgeon.  Be later addressed as she is re-seen.  I did give her Rocephin 1000 mg and she will start on doxycycline p.o. at home.   (including critical care time)  Medications Ordered in UC Medications  cefTRIAXone (ROCEPHIN) injection 1 g (1 g Intramuscular Given 12/15/17 0946)     Initial Impression / Assessment and Plan / UC Course  I have reviewed the triage vital signs and the nursing notes.  Pertinent labs & imaging results that were available during my care of the patient were reviewed by me and considered in my medical decision making (see chart for details).     Plan: 1. Test/x-ray results and diagnosis reviewed with  patient 2. rx as per orders; risks, benefits, potential side effects reviewed with patient 3. Recommend supportive treatment with worsening movement of the drainage as necessary.  If it becomes too copious she should return to our clinic or to the emergency room.  We will start taking doxycycline this afternoon 1 twice a day given her 7 days supply.  We will have her return tomorrow for the anticipated drainage for dressing change and  possible repacking. If She runs high fevers or has any other changes and is not improving she should go immediately to the emergency room 4. F/u prn if symptoms worsen or don't improve   Final Clinical Impressions(s) / UC Diagnoses   Final diagnoses:  Abscess    ED Discharge Orders        Ordered    doxycycline (VIBRAMYCIN) 100 MG capsule  2 times daily     12/15/17 0937    mupirocin ointment (BACTROBAN) 2 %  3 times daily     12/15/17 4818       Controlled Substance Prescriptions [place] Controlled Substance Registry consulted? Not Applicable   Lorin Picket, PA-C 12/15/17 1005

## 2017-12-15 NOTE — Discharge Instructions (Signed)
Return to our clinic tomorrow for a wound recheck and repacking.  If you run high fevers or if there is excessive drainage is not contained with the dressing return to our clinic or go to the emergency room. Keep the area dry until you are seen in follow-up tomorrow.

## 2017-12-15 NOTE — ED Triage Notes (Signed)
Patient in today c/o 1 week history of abscess/boil on her lower abdomen.

## 2017-12-16 ENCOUNTER — Ambulatory Visit
Admission: EM | Admit: 2017-12-16 | Discharge: 2017-12-16 | Disposition: A | Discharge status: Home / Self Care | Payer: 59 | Attending: Family Medicine | Admitting: Family Medicine

## 2017-12-16 ENCOUNTER — Other Ambulatory Visit: Payer: Self-pay

## 2017-12-16 ENCOUNTER — Encounter: Payer: Self-pay | Admitting: Emergency Medicine

## 2017-12-16 DIAGNOSIS — Z5189 Encounter for other specified aftercare: Secondary | ICD-10-CM

## 2017-12-16 DIAGNOSIS — L02211 Cutaneous abscess of abdominal wall: Secondary | ICD-10-CM

## 2017-12-16 DIAGNOSIS — L0291 Cutaneous abscess, unspecified: Secondary | ICD-10-CM

## 2017-12-16 NOTE — Discharge Instructions (Signed)
Start oral antibiotic Follow up on Sunday

## 2017-12-16 NOTE — ED Triage Notes (Signed)
Patient in today for 1 day follow up of abscess lower abdomen. Patient states it is still very painful.

## 2017-12-16 NOTE — ED Provider Notes (Signed)
[facility]    CSN: [id number] Arrival date & time: 12/16/17  7412     History   Chief Complaint Chief Complaint  Patient presents with  . Abscess    1 day follow up    HPI Debbie Powers is a 57 y.o. female.   57 yo female with a c/o abscess follow up. Patient was seen yesterday for a lower abdominal skin abscess which was drained and packed. Patient states she did not start the oral antibiotic because "the pharmacy didn't call me until it was too late". Denies any fevers or chills.    The history is provided by the patient.  Abscess    Past Medical History:  Diagnosis Date  . Bilateral carpal tunnel syndrome   . Degenerative disc disease, cervical   . Degenerative disc disease, lumbar   . Depression   . Diabetes mellitus type 2 with retinopathy ([place])   . GERD (gastroesophageal reflux disease)   . History of herpes genitalis   . Hyperlipidemia   . Hypertension   . Obesity   . Osteoarthritis of both knees   . Seasonal rhinitis   . Wears dentures     There are no active problems to display for this patient.   Past Surgical History:  Procedure Laterality Date  . CARPAL TUNNEL RELEASE Left 04/09/2016   Procedure: LEFT OPEN CARPAL TUNNEL RELEASE;  Surgeon: Leanor Kail, MD;  Location: [place];  Service: Orthopedics;  Laterality: Left;  . COLONOSCOPY    . FLEXIBLE SIGMOIDOSCOPY  2001  . NOSE SURGERY  1994  . open carpal tunnel release Right 2015    OB History    No data available       Home Medications    Prior to Admission medications   Medication Sig Start Date End Date Taking? Authorizing Provider  benazepril (LOTENSIN) 40 MG tablet Take 40 mg by mouth daily.   Yes [provider]  citalopram (CELEXA) 40 MG tablet Take 40 mg by mouth daily.   Yes [provider]  cyclobenzaprine (FLEXERIL) 10 MG tablet Take 10 mg by mouth 3 (three) times daily as needed for muscle spasms.   Yes [provider]   estradiol (ESTRACE) 0.5 MG tablet Take 0.5 mg by mouth daily.   Yes [provider]  etodolac (LODINE) 400 MG tablet Take 400 mg by mouth 2 (two) times daily.   Yes [provider]  gabapentin (NEURONTIN) 300 MG capsule Take 300-600 mg by mouth at bedtime as needed.   Yes [provider]  glimepiride (AMARYL) 2 MG tablet Take 2 mg by mouth daily with breakfast.   Yes [provider]  ibuprofen (ADVIL,MOTRIN) 200 MG tablet Take 400-800 mg by mouth every 8 (eight) hours as needed.   Yes [provider]  medroxyPROGESTERone (PROVERA) 2.5 MG tablet Take 2.5 mg by mouth daily.   Yes [provider]  metFORMIN (GLUCOPHAGE-XR) 500 MG 24 hr tablet Take 1,000 mg by mouth 2 (two) times daily with a meal.   Yes [provider]  omeprazole (PRILOSEC OTC) 20 MG tablet Take 20 mg by mouth daily.   Yes [provider]  potassium chloride SA (K-DUR,KLOR-CON) 20 MEQ tablet Take 20 mEq by mouth 3 (three) times daily.   Yes [provider]  triamterene-hydrochlorothiazide (MAXZIDE-25) 37.5-25 MG tablet Take 1 tablet by mouth daily.   Yes [provider]  zolpidem (AMBIEN) 10 MG tablet Take 10 mg by mouth at bedtime  as needed for sleep.   Yes [provider]  doxycycline (VIBRAMYCIN) 100 MG capsule Take 1 capsule (100 mg total) by mouth 2 (two) times daily. 12/15/17   Lorin Picket, PA-C  mupirocin ointment (BACTROBAN) 2 % Apply 1 application topically 3 (three) times daily. 12/15/17   Lorin Picket, PA-C    Family History Family History  Problem Relation Age of Onset  . Heart attack Mother   . Diabetes Mother   . Hypertension Mother   . Stroke Father   . Hypertension Father   . Diabetes Father     Social History Social History   Tobacco Use  . Smoking status: Former Smoker    Last attempt to quit: 1997    Years since quitting: 22.1  . Smokeless tobacco: Never Used  Substance Use Topics  .  Alcohol use: No  . Drug use: No     Allergies   Patient has no known allergies.   Review of Systems Review of Systems   Physical Exam Triage Vital Signs ED Triage Vitals [12/16/17 1035]  Enc Vitals Group     BP (!) 149/81     Pulse Rate (!) 108     Resp 16     Temp 98.8 F (37.1 C)     Temp Source Oral     SpO2 98 %     Weight 236 lb (107 kg)     Height 5\' 6"  (1.676 m)     Head Circumference      Peak Flow      Pain Score 8     Pain Loc      Pain Edu?      Excl. in [place]?    No data found.  Updated Vital Signs BP (!) 149/81 (BP Location: Left Arm)   Pulse (!) 108   Temp 98.8 F (37.1 C) (Oral)   Resp 16   Ht 5\' 6"  (1.676 m)   Wt 236 lb (107 kg)   SpO2 98%   BMI 38.09 kg/m   Visual Acuity Right Eye Distance:   Left Eye Distance:   Bilateral Distance:    Right Eye Near:   Left Eye Near:    Bilateral Near:     Physical Exam  Constitutional: She appears well-developed and well-nourished. No distress.  Skin: She is not diaphoretic.  Lower abdominal skin abscess with packing in place; moderate bloody purulent drainage present; mild tenderness to palpation  Nursing note and vitals reviewed.    UC Treatments / Results  Labs (all labs ordered are listed, but only abnormal results are displayed) Labs Reviewed - No data to display  EKG  EKG Interpretation None       Radiology No results found.  Procedures Procedures (including critical care time)  Medications Ordered in UC Medications - No data to display   Initial Impression / Assessment and Plan / UC Course  I have reviewed the triage vital signs and the nursing notes.  Pertinent labs & imaging results that were available during my care of the patient were reviewed by me and considered in my medical decision making (see chart for details).       Final Clinical Impressions(s) / UC Diagnoses   Final diagnoses:  Abscess    ED Discharge Orders    None     1. diagnosis reviewed  with patient 2. Stressed importance of starting oral antibiotic ASAP today 3. Packing removed and wound repacked 4. Follow-up in 2 days for  re-evaluation or sooner prn  Controlled Substance Prescriptions [place] Controlled Substance Registry consulted? Not Applicable   [name], MD 12/16/17 1137

## 2017-12-17 LAB — AEROBIC CULTURE W GRAM STAIN (SUPERFICIAL SPECIMEN): Special Requests: NORMAL

## 2017-12-17 LAB — AEROBIC CULTURE  (SUPERFICIAL SPECIMEN)

## 2017-12-18 ENCOUNTER — Other Ambulatory Visit: Payer: Self-pay

## 2017-12-18 ENCOUNTER — Ambulatory Visit
Admission: EM | Admit: 2017-12-18 | Discharge: 2017-12-18 | Disposition: A | Discharge status: Home / Self Care | Payer: 59 | Attending: Family Medicine | Admitting: Family Medicine

## 2017-12-18 DIAGNOSIS — L02211 Cutaneous abscess of abdominal wall: Secondary | ICD-10-CM

## 2017-12-18 DIAGNOSIS — L0291 Cutaneous abscess, unspecified: Secondary | ICD-10-CM

## 2017-12-18 NOTE — ED Triage Notes (Signed)
Pt here for f/u of I&D abscess to abdomen. Still draining some reddish drainage. Pain 5/10

## 2017-12-18 NOTE — ED Notes (Signed)
ABD dressing placed over wound site.

## 2017-12-18 NOTE — ED Provider Notes (Signed)
[facility]    CSN: [id number] Arrival date & time: 12/18/17  5638     History   Chief Complaint Chief Complaint  Patient presents with  . Wound Check    HPI Debbie Powers is a 57 y.o. female.   57 yo female with a h/o lower abdominal abscess s/p I&D here for recheck of wound. States still draining. Denies any fevers or chills.    The history is provided by the patient.  Wound Check     Past Medical History:  Diagnosis Date  . Bilateral carpal tunnel syndrome   . Degenerative disc disease, cervical   . Degenerative disc disease, lumbar   . Depression   . Diabetes mellitus type 2 with retinopathy ([place])   . GERD (gastroesophageal reflux disease)   . History of herpes genitalis   . Hyperlipidemia   . Hypertension   . Obesity   . Osteoarthritis of both knees   . Seasonal rhinitis   . Wears dentures     There are no active problems to display for this patient.   Past Surgical History:  Procedure Laterality Date  . CARPAL TUNNEL RELEASE Left 04/09/2016   Procedure: LEFT OPEN CARPAL TUNNEL RELEASE;  Surgeon: Leanor Kail, MD;  Location: [facility];  Service: Orthopedics;  Laterality: Left;  . COLONOSCOPY    . FLEXIBLE SIGMOIDOSCOPY  2001  . NOSE SURGERY  1994  . open carpal tunnel release Right 2015    OB History    No data available       Home Medications    Prior to Admission medications   Medication Sig Start Date End Date Taking? Authorizing Provider  benazepril (LOTENSIN) 40 MG tablet Take 40 mg by mouth daily.    [provider]  citalopram (CELEXA) 40 MG tablet Take 40 mg by mouth daily.    [provider]  cyclobenzaprine (FLEXERIL) 10 MG tablet Take 10 mg by mouth 3 (three) times daily as needed for muscle spasms.    [provider]  doxycycline (VIBRAMYCIN) 100 MG capsule Take 1 capsule (100 mg total) by mouth 2 (two) times daily. 12/15/17   Lorin Picket, PA-C  estradiol (ESTRACE) 0.5  MG tablet Take 0.5 mg by mouth daily.    [provider]  etodolac (LODINE) 400 MG tablet Take 400 mg by mouth 2 (two) times daily.    [provider]  gabapentin (NEURONTIN) 300 MG capsule Take 300-600 mg by mouth at bedtime as needed.    [provider]  glimepiride (AMARYL) 2 MG tablet Take 2 mg by mouth daily with breakfast.    [provider]  ibuprofen (ADVIL,MOTRIN) 200 MG tablet Take 400-800 mg by mouth every 8 (eight) hours as needed.    [provider]  medroxyPROGESTERone (PROVERA) 2.5 MG tablet Take 2.5 mg by mouth daily.    [provider]  metFORMIN (GLUCOPHAGE-XR) 500 MG 24 hr tablet Take 1,000 mg by mouth 2 (two) times daily with a meal.    [provider]  mupirocin ointment (BACTROBAN) 2 % Apply 1 application topically 3 (three) times daily. 12/15/17   Lorin Picket, PA-C  omeprazole (PRILOSEC OTC) 20 MG tablet Take 20 mg by mouth daily.    [provider]  potassium chloride SA (K-DUR,KLOR-CON) 20 MEQ tablet Take 20 mEq by mouth 3 (three) times daily.    [provider]  triamterene-hydrochlorothiazide (MAXZIDE-25) 37.5-25 MG tablet Take 1 tablet by mouth daily.  [provider]  zolpidem (AMBIEN) 10 MG tablet Take 10 mg by mouth at bedtime as needed for sleep.    [provider]    Family History Family History  Problem Relation Age of Onset  . Heart attack Mother   . Diabetes Mother   . Hypertension Mother   . Stroke Father   . Hypertension Father   . Diabetes Father     Social History Social History   Tobacco Use  . Smoking status: Former Smoker    Last attempt to quit: 1997    Years since quitting: 22.1  . Smokeless tobacco: Never Used  Substance Use Topics  . Alcohol use: No  . Drug use: No     Allergies   Patient has no known allergies.   Review of Systems Review of Systems   Physical Exam Triage Vital Signs ED Triage Vitals  Enc Vitals  Group     BP 12/18/17 0905 127/73     Pulse Rate 12/18/17 0905 95     Resp 12/18/17 0905 20     Temp 12/18/17 0905 98.2 F (36.8 C)     Temp Source 12/18/17 0905 Oral     SpO2 12/18/17 0905 100 %     Weight 12/18/17 0907 236 lb (107 kg)     Height 12/18/17 0907 5\' 6"  (1.676 m)     Head Circumference --      Peak Flow --      Pain Score 12/18/17 0907 5     Pain Loc --      Pain Edu? --      Excl. in [place]? --    No data found.  Updated Vital Signs BP 127/73 (BP Location: Right Arm)   Pulse 95   Temp 98.2 F (36.8 C) (Oral)   Resp 20   Ht 5\' 6"  (1.676 m)   Wt 236 lb (107 kg)   SpO2 100%   BMI 38.09 kg/m   Visual Acuity Right Eye Distance:   Left Eye Distance:   Bilateral Distance:    Right Eye Near:   Left Eye Near:    Bilateral Near:     Physical Exam  Constitutional: She appears well-developed and well-nourished. No distress.  Skin: She is not diaphoretic.  Abdominal wound: with packing; purulent sanguinous drainage  Nursing note and vitals reviewed.    UC Treatments / Results  Labs (all labs ordered are listed, but only abnormal results are displayed) Labs Reviewed - No data to display  EKG  EKG Interpretation None       Radiology No results found.  Procedures Procedures (including critical care time)  Medications Ordered in UC Medications - No data to display   Initial Impression / Assessment and Plan / UC Course  I have reviewed the triage vital signs and the nursing notes.  Pertinent labs & imaging results that were available during my care of the patient were reviewed by me and considered in my medical decision making (see chart for details).       Final Clinical Impressions(s) / UC Diagnoses   Final diagnoses:  Abscess    ED Discharge Orders    None     1. diagnosis reviewed with patient 2. Packing removed and wound repacked 3. Continue oral antibiotic 4. Follow up in 2 days for re-evaluation and repacking of  wound   Controlled Substance Prescriptions [place] Controlled Substance Registry consulted? Not Applicable   [name], MD 12/18/17 1226

## 2017-12-18 NOTE — Discharge Instructions (Signed)
Continue antibiotic Follow up in 2 days

## 2017-12-20 ENCOUNTER — Encounter: Payer: Self-pay | Admitting: Emergency Medicine

## 2017-12-20 ENCOUNTER — Ambulatory Visit
Admission: EM | Admit: 2017-12-20 | Discharge: 2017-12-20 | Disposition: A | Discharge status: Home / Self Care | Payer: 59 | Attending: Family Medicine | Admitting: Family Medicine

## 2017-12-20 ENCOUNTER — Other Ambulatory Visit: Payer: Self-pay

## 2017-12-20 DIAGNOSIS — L0291 Cutaneous abscess, unspecified: Secondary | ICD-10-CM

## 2017-12-20 DIAGNOSIS — L02211 Cutaneous abscess of abdominal wall: Secondary | ICD-10-CM

## 2017-12-20 NOTE — Discharge Instructions (Signed)
Follow up here on Thursday with Debbie Powers

## 2017-12-20 NOTE — ED Provider Notes (Signed)
[facility]    CSN: [id number] Arrival date & time: 12/20/17  0915     History   Chief Complaint Chief Complaint  Patient presents with  . Abscess    follow up    HPI Debbie Powers is a 57 y.o. female.   57 yo female s/p abdominal skin abscess I&D here for wound recheck. States it's improving but still draining. Less pain and denies any fevers or chills.    The history is provided by the patient.  Abscess    Past Medical History:  Diagnosis Date  . Bilateral carpal tunnel syndrome   . Degenerative disc disease, cervical   . Degenerative disc disease, lumbar   . Depression   . Diabetes mellitus type 2 with retinopathy ([place])   . GERD (gastroesophageal reflux disease)   . History of herpes genitalis   . Hyperlipidemia   . Hypertension   . Obesity   . Osteoarthritis of both knees   . Seasonal rhinitis   . Wears dentures     There are no active problems to display for this patient.   Past Surgical History:  Procedure Laterality Date  . CARPAL TUNNEL RELEASE Left 04/09/2016   Procedure: LEFT OPEN CARPAL TUNNEL RELEASE;  Surgeon: Leanor Kail, MD;  Location: [facility];  Service: Orthopedics;  Laterality: Left;  . COLONOSCOPY    . FLEXIBLE SIGMOIDOSCOPY  2001  . NOSE SURGERY  1994  . open carpal tunnel release Right 2015    OB History    No data available       Home Medications    Prior to Admission medications   Medication Sig Start Date End Date Taking? Authorizing Provider  benazepril (LOTENSIN) 40 MG tablet Take 40 mg by mouth daily.   Yes [provider]  citalopram (CELEXA) 40 MG tablet Take 40 mg by mouth daily.   Yes [provider]  cyclobenzaprine (FLEXERIL) 10 MG tablet Take 10 mg by mouth 3 (three) times daily as needed for muscle spasms.   Yes [provider]  doxycycline (VIBRAMYCIN) 100 MG capsule Take 1 capsule (100 mg total) by mouth 2 (two) times daily. 12/15/17  Yes Lorin Picket, PA-C  estradiol (ESTRACE) 0.5 MG tablet Take 0.5 mg by mouth daily.   Yes [provider]  etodolac (LODINE) 400 MG tablet Take 400 mg by mouth 2 (two) times daily.   Yes [provider]  gabapentin (NEURONTIN) 300 MG capsule Take 300-600 mg by mouth at bedtime as needed.   Yes [provider]  glimepiride (AMARYL) 2 MG tablet Take 2 mg by mouth daily with breakfast.   Yes [provider]  ibuprofen (ADVIL,MOTRIN) 200 MG tablet Take 400-800 mg by mouth every 8 (eight) hours as needed.   Yes [provider]  medroxyPROGESTERone (PROVERA) 2.5 MG tablet Take 2.5 mg by mouth daily.   Yes [provider]  metFORMIN (GLUCOPHAGE-XR) 500 MG 24 hr tablet Take 1,000 mg by mouth 2 (two) times daily with a meal.   Yes [provider]  omeprazole (PRILOSEC OTC) 20 MG tablet Take 20 mg by mouth daily.   Yes [provider]  potassium chloride SA (K-DUR,KLOR-CON) 20 MEQ tablet Take 20 mEq by mouth 3 (three) times daily.   Yes [provider]  triamterene-hydrochlorothiazide (MAXZIDE-25) 37.5-25 MG tablet Take 1 tablet by mouth daily.   Yes [provider]  zolpidem (AMBIEN) 10 MG tablet Take 10 mg by mouth at bedtime  as needed for sleep.   Yes [provider]  mupirocin ointment (BACTROBAN) 2 % Apply 1 application topically 3 (three) times daily. 12/15/17   Lorin Picket, PA-C    Family History Family History  Problem Relation Age of Onset  . Heart attack Mother   . Diabetes Mother   . Hypertension Mother   . Stroke Father   . Hypertension Father   . Diabetes Father     Social History Social History   Tobacco Use  . Smoking status: Former Smoker    Last attempt to quit: 1997    Years since quitting: 22.1  . Smokeless tobacco: Never Used  Substance Use Topics  . Alcohol use: No  . Drug use: No     Allergies   Patient has no known allergies.   Review of Systems Review of  Systems   Physical Exam Triage Vital Signs ED Triage Vitals  Enc Vitals Group     BP 12/20/17 0944 137/90     Pulse Rate 12/20/17 0944 97     Resp 12/20/17 0944 16     Temp 12/20/17 0944 98.2 F (36.8 C)     Temp Source 12/20/17 0944 Oral     SpO2 12/20/17 0944 98 %     Weight 12/20/17 0944 236 lb (107 kg)     Height 12/20/17 0944 5\' 6"  (1.676 m)     Head Circumference --      Peak Flow --      Pain Score 12/20/17 0943 5     Pain Loc --      Pain Edu? --      Excl. in [place]? --    No data found.  Updated Vital Signs BP 137/90 (BP Location: Left Arm)   Pulse 97   Temp 98.2 F (36.8 C) (Oral)   Resp 16   Ht 5\' 6"  (1.676 m)   Wt 236 lb (107 kg)   SpO2 98%   BMI 38.09 kg/m   Visual Acuity Right Eye Distance:   Left Eye Distance:   Bilateral Distance:    Right Eye Near:   Left Eye Near:    Bilateral Near:     Physical Exam  Constitutional: She appears well-developed and well-nourished. No distress.  Skin: She is not diaphoretic.  Abdominal skin I&D wound with mild-moderate purulent sanguinous drainage  Nursing note and vitals reviewed.    UC Treatments / Results  Labs (all labs ordered are listed, but only abnormal results are displayed) Labs Reviewed - No data to display  EKG  EKG Interpretation None       Radiology No results found.  Procedures Procedures (including critical care time)  Medications Ordered in UC Medications - No data to display   Initial Impression / Assessment and Plan / UC Course  I have reviewed the triage vital signs and the nursing notes.  Pertinent labs & imaging results that were available during my care of the patient were reviewed by me and considered in my medical decision making (see chart for details).       Final Clinical Impressions(s) / UC Diagnoses   Final diagnoses:  Abscess    ED Discharge Orders    None     1. diagnosis reviewed with patient 2. Wound repacked 3. Continue current  antibiotic 4. Follow-up in 2 days for recheck/repack  Controlled Substance Prescriptions [place] Controlled Substance Registry consulted? Not Applicable   [name], MD 12/20/17 1155

## 2017-12-20 NOTE — ED Triage Notes (Signed)
Patient in today to follow up of I&D abscess lower abdomen.

## 2017-12-22 ENCOUNTER — Encounter: Payer: Self-pay | Admitting: *Deleted

## 2017-12-22 ENCOUNTER — Ambulatory Visit
Admission: EM | Admit: 2017-12-22 | Discharge: 2017-12-22 | Disposition: A | Discharge status: Home / Self Care | Payer: 59 | Attending: Family Medicine | Admitting: Family Medicine

## 2017-12-22 DIAGNOSIS — Z4803 Encounter for change or removal of drains: Secondary | ICD-10-CM

## 2017-12-22 DIAGNOSIS — Z09 Encounter for follow-up examination after completed treatment for conditions other than malignant neoplasm: Secondary | ICD-10-CM

## 2017-12-22 DIAGNOSIS — L02211 Cutaneous abscess of abdominal wall: Secondary | ICD-10-CM

## 2017-12-22 DIAGNOSIS — Z5189 Encounter for other specified aftercare: Secondary | ICD-10-CM

## 2017-12-22 NOTE — ED Triage Notes (Signed)
Pt seen here last week for abcess. Pt here for wound check and possible removal of packing.

## 2017-12-22 NOTE — ED Provider Notes (Signed)
[facility]    CSN: [id number] Arrival date & time: 12/22/17  1005     History   Chief Complaint Chief Complaint  Patient presents with  . Wound Check    HPI Debbie Powers is a 57 y.o. female.   HPI  Ms. Antonopoulos returns today dating that she has noticed an improvement in how she feels and also in the wound.  It is still continuing to drain.  She has had no fever or chills.  She is taking her last dose of the oral antibiotic today.        Past Medical History:  Diagnosis Date  . Bilateral carpal tunnel syndrome   . Degenerative disc disease, cervical   . Degenerative disc disease, lumbar   . Depression   . Diabetes mellitus type 2 with retinopathy ([place])   . GERD (gastroesophageal reflux disease)   . History of herpes genitalis   . Hyperlipidemia   . Hypertension   . Obesity   . Osteoarthritis of both knees   . Seasonal rhinitis   . Wears dentures     There are no active problems to display for this patient.   Past Surgical History:  Procedure Laterality Date  . CARPAL TUNNEL RELEASE Left 04/09/2016   Procedure: LEFT OPEN CARPAL TUNNEL RELEASE;  Surgeon: Leanor Kail, MD;  Location: [place];  Service: Orthopedics;  Laterality: Left;  . COLONOSCOPY    . FLEXIBLE SIGMOIDOSCOPY  2001  . NOSE SURGERY  1994  . open carpal tunnel release Right 2015    OB History    No data available       Home Medications    Prior to Admission medications   Medication Sig Start Date End Date Taking? Authorizing Provider  benazepril (LOTENSIN) 40 MG tablet Take 40 mg by mouth daily.   Yes [provider]  citalopram (CELEXA) 40 MG tablet Take 40 mg by mouth daily.   Yes [provider]  cyclobenzaprine (FLEXERIL) 10 MG tablet Take 10 mg by mouth 3 (three) times daily as needed for muscle spasms.   Yes [provider]  doxycycline (VIBRAMYCIN) 100 MG capsule Take 1 capsule (100 mg total) by mouth 2 (two) times  daily. 12/15/17  Yes Lorin Picket, PA-C  estradiol (ESTRACE) 0.5 MG tablet Take 0.5 mg by mouth daily.   Yes [provider]  etodolac (LODINE) 400 MG tablet Take 400 mg by mouth 2 (two) times daily.   Yes [provider]  gabapentin (NEURONTIN) 300 MG capsule Take 300-600 mg by mouth at bedtime as needed.   Yes [provider]  glimepiride (AMARYL) 2 MG tablet Take 2 mg by mouth daily with breakfast.   Yes [provider]  ibuprofen (ADVIL,MOTRIN) 200 MG tablet Take 400-800 mg by mouth every 8 (eight) hours as needed.   Yes [provider]  medroxyPROGESTERone (PROVERA) 2.5 MG tablet Take 2.5 mg by mouth daily.   Yes [provider]  metFORMIN (GLUCOPHAGE-XR) 500 MG 24 hr tablet Take 1,000 mg by mouth 2 (two) times daily with a meal.   Yes [provider]  omeprazole (PRILOSEC OTC) 20 MG tablet Take 20 mg by mouth daily.   Yes [provider]  potassium chloride SA (K-DUR,KLOR-CON) 20 MEQ tablet Take 20 mEq by mouth 3 (three) times daily.   Yes [provider]  triamterene-hydrochlorothiazide (MAXZIDE-25) 37.5-25 MG tablet Take 1 tablet by mouth daily.   Yes [provider]  zolpidem (  AMBIEN) 10 MG tablet Take 10 mg by mouth at bedtime as needed for sleep.   Yes [provider]  mupirocin ointment (BACTROBAN) 2 % Apply 1 application topically 3 (three) times daily. 12/15/17   Lorin Picket, PA-C    Family History Family History  Problem Relation Age of Onset  . Heart attack Mother   . Diabetes Mother   . Hypertension Mother   . Stroke Father   . Hypertension Father   . Diabetes Father     Social History Social History   Tobacco Use  . Smoking status: Former Smoker    Last attempt to quit: 1997    Years since quitting: 22.1  . Smokeless tobacco: Never Used  Substance Use Topics  . Alcohol use: No  . Drug use: No     Allergies   Patient has no known allergies.   Review  of Systems Review of Systems  All other systems reviewed and are negative.    Physical Exam Triage Vital Signs ED Triage Vitals  Enc Vitals Group     BP 12/22/17 1034 (!) 142/86     Pulse Rate 12/22/17 1034 96     Resp 12/22/17 1034 16     Temp 12/22/17 1034 98.1 F (36.7 C)     Temp Source 12/22/17 1034 Oral     SpO2 12/22/17 1034 100 %     Weight 12/22/17 1035 235 lb (106.6 kg)     Height 12/22/17 1035 5\' 6"  (1.676 m)     Head Circumference --      Peak Flow --      Pain Score --      Pain Loc --      Pain Edu? --      Excl. in [place]? --    No data found.  Updated Vital Signs BP (!) 142/86 (BP Location: Left Arm)   Pulse 96   Temp 98.1 F (36.7 C) (Oral)   Resp 16   Ht 5\' 6"  (1.676 m)   Wt 235 lb (106.6 kg)   SpO2 100%   BMI 37.93 kg/m   Visual Acuity Right Eye Distance:   Left Eye Distance:   Bilateral Distance:    Right Eye Near:   Left Eye Near:    Bilateral Near:     Physical Exam  Constitutional: She is oriented to person, place, and time. She appears well-developed and well-nourished. No distress.  HENT:  Head: Normocephalic.  Eyes: Pupils are equal, round, and reactive to light. Right eye exhibits no discharge. Left eye exhibits no discharge.  Neck: Normal range of motion.  Musculoskeletal: Normal range of motion.  Neurological: She is alert and oriented to person, place, and time.  Skin: Skin is warm and dry. She is not diaphoretic.  Examination of the incision area of the drain to be in place.  It has purulent adherent in age to the wound itself.  There was no purulence expressed from the wound today.  It appears to be clean and dry.  Induration is gone.  After thorough cleansing of the skin and under sterile technique an new drain was placed at the depth of the wound.  This was packed loosely to allow healing.  Approximately 4 inches of continuous gauze was placed in the wound.  This was covered with a Band-Aid.   We will allow the patient to  return to work tomorrow.  She will put a new dressing on before work and then change it  afterwards.  Will return to our clinic in 2 days for follow-up and possible repacking.  This was fully explained to the patient.  Psychiatric: She has a normal mood and affect. Her behavior is normal. Judgment and thought content normal.  Nursing note and vitals reviewed.    UC Treatments / Results  Labs (all labs ordered are listed, but only abnormal results are displayed) Labs Reviewed - No data to display  EKG  EKG Interpretation None       Radiology No results found.  Procedures Procedures (including critical care time)  Medications Ordered in UC Medications - No data to display   Initial Impression / Assessment and Plan / UC Course  I have reviewed the triage vital signs and the nursing notes.  Pertinent labs & imaging results that were available during my care of the patient were reviewed by me and considered in my medical decision making (see chart for details).     Plan: 1. Test/x-ray results and diagnosis reviewed with patient 2. rx as per orders; risks, benefits, potential side effects reviewed with patient 3. Recommend supportive treatment with continue the same course and follow-up in 2 days for reevaluation and repacking likely. 4. F/u prn if symptoms worsen or don't improve   Final Clinical Impressions(s) / UC Diagnoses   Final diagnoses:  Visit for wound check  Encounter for recheck of abscess following incision and drainage    ED Discharge Orders    None       Controlled Substance Prescriptions [place] Controlled Substance Registry consulted? Not Applicable   Lorin Picket, PA-C 12/22/17 1158

## 2017-12-24 ENCOUNTER — Ambulatory Visit
Admission: EM | Admit: 2017-12-24 | Discharge: 2017-12-24 | Disposition: A | Discharge status: Home / Self Care | Payer: 59 | Attending: Family Medicine | Admitting: Family Medicine

## 2017-12-24 ENCOUNTER — Other Ambulatory Visit: Payer: Self-pay

## 2017-12-24 DIAGNOSIS — Z09 Encounter for follow-up examination after completed treatment for conditions other than malignant neoplasm: Secondary | ICD-10-CM

## 2017-12-24 DIAGNOSIS — L02211 Cutaneous abscess of abdominal wall: Secondary | ICD-10-CM

## 2017-12-24 NOTE — ED Provider Notes (Signed)
[facility]    CSN: [id number] Arrival date & time: 12/24/17  1027     History   Chief Complaint Chief Complaint  Patient presents with  . Wound Check    HPI Debbie Powers is a 57 y.o. female.   HPI  Debbie Powers returns today for a wound check.  That she is not having any problems.  The drain is in place.  States she did not return to work stating that her employer told her just to go ahead and take off till Monday.  Is noted that it is still draining but is lessening.  She has no pain.  Finished her antibiotic        Past Medical History:  Diagnosis Date  . Bilateral carpal tunnel syndrome   . Degenerative disc disease, cervical   . Degenerative disc disease, lumbar   . Depression   . Diabetes mellitus type 2 with retinopathy ([place])   . GERD (gastroesophageal reflux disease)   . History of herpes genitalis   . Hyperlipidemia   . Hypertension   . Obesity   . Osteoarthritis of both knees   . Seasonal rhinitis   . Wears dentures     There are no active problems to display for this patient.   Past Surgical History:  Procedure Laterality Date  . CARPAL TUNNEL RELEASE Left 04/09/2016   Procedure: LEFT OPEN CARPAL TUNNEL RELEASE;  Surgeon: Leanor Kail, MD;  Location: [place];  Service: Orthopedics;  Laterality: Left;  . COLONOSCOPY    . FLEXIBLE SIGMOIDOSCOPY  2001  . NOSE SURGERY  1994  . open carpal tunnel release Right 2015    OB History    No data available       Home Medications    Prior to Admission medications   Medication Sig Start Date End Date Taking? Authorizing Provider  benazepril (LOTENSIN) 40 MG tablet Take 40 mg by mouth daily.    [provider]  citalopram (CELEXA) 40 MG tablet Take 40 mg by mouth daily.    [provider]  cyclobenzaprine (FLEXERIL) 10 MG tablet Take 10 mg by mouth 3 (three) times daily as needed for muscle spasms.    [provider]  estradiol (ESTRACE) 0.5  MG tablet Take 0.5 mg by mouth daily.    [provider]  etodolac (LODINE) 400 MG tablet Take 400 mg by mouth 2 (two) times daily.    [provider]  gabapentin (NEURONTIN) 300 MG capsule Take 300-600 mg by mouth at bedtime as needed.    [provider]  glimepiride (AMARYL) 2 MG tablet Take 2 mg by mouth daily with breakfast.    [provider]  ibuprofen (ADVIL,MOTRIN) 200 MG tablet Take 400-800 mg by mouth every 8 (eight) hours as needed.    [provider]  medroxyPROGESTERone (PROVERA) 2.5 MG tablet Take 2.5 mg by mouth daily.    [provider]  metFORMIN (GLUCOPHAGE-XR) 500 MG 24 hr tablet Take 1,000 mg by mouth 2 (two) times daily with a meal.    [provider]  mupirocin ointment (BACTROBAN) 2 % Apply 1 application topically 3 (three) times daily. 12/15/17   Lorin Picket, PA-C  omeprazole (PRILOSEC OTC) 20 MG tablet Take 20 mg by mouth daily.    [provider]  potassium chloride SA (K-DUR,KLOR-CON) 20 MEQ tablet Take 20 mEq by mouth 3 (three) times daily.    [provider]  triamterene-hydrochlorothiazide (MAXZIDE-25) 37.5-25 MG tablet  Take 1 tablet by mouth daily.    [provider]  zolpidem (AMBIEN) 10 MG tablet Take 10 mg by mouth at bedtime as needed for sleep.    [provider]    Family History Family History  Problem Relation Age of Onset  . Heart attack Mother   . Diabetes Mother   . Hypertension Mother   . Stroke Father   . Hypertension Father   . Diabetes Father     Social History Social History   Tobacco Use  . Smoking status: Former Smoker    Last attempt to quit: 1997    Years since quitting: 22.1  . Smokeless tobacco: Never Used  Substance Use Topics  . Alcohol use: No  . Drug use: No     Allergies   Patient has no known allergies.   Review of Systems Review of Systems  All other systems reviewed and are negative.    Physical  Exam Triage Vital Signs ED Triage Vitals  Enc Vitals Group     BP 12/24/17 1123 135/84     Pulse Rate 12/24/17 1123 (!) 101     Resp 12/24/17 1123 20     Temp 12/24/17 1123 98.5 F (36.9 C)     Temp Source 12/24/17 1123 Oral     SpO2 12/24/17 1123 99 %     Weight 12/24/17 1125 241 lb 6 oz (109.5 kg)     Height 12/24/17 1125 5\' 6"  (1.676 m)     Head Circumference --      Peak Flow --      Pain Score 12/24/17 1125 0     Pain Loc --      Pain Edu? --      Excl. in [place]? --    No data found.  Updated Vital Signs BP 135/84 (BP Location: Left Arm)   Pulse (!) 101   Temp 98.5 F (36.9 C) (Oral)   Resp 20   Ht 5\' 6"  (1.676 m)   Wt 241 lb 6 oz (109.5 kg)   SpO2 99%   BMI 38.96 kg/m   Visual Acuity Right Eye Distance:   Left Eye Distance:   Bilateral Distance:    Right Eye Near:   Left Eye Near:    Bilateral Near:     Physical Exam  Constitutional: She appears well-developed and well-nourished. No distress.  Skin: Skin is warm and dry. She is not diaphoretic.  Emanation shows the drain to be in place.  This was removed without incident.  A minimal amount of purulence adherent to the drain.  No purulence could be expressed from the wound itself.  It is very soft.  There is no induration present.  Psychiatric: She has a normal mood and affect. Her behavior is normal. Judgment and thought content normal.  Nursing note and vitals reviewed.    UC Treatments / Results  Labs (all labs ordered are listed, but only abnormal results are displayed) Labs Reviewed - No data to display  EKG  EKG Interpretation None       Radiology No results found.  Procedures Procedures (including critical care time)  Medications Ordered in UC Medications - No data to display   Initial Impression / Assessment and Plan / UC Course  I have reviewed the triage vital signs and the nursing notes.  Pertinent labs & imaging results that were available during my care of the patient were  reviewed by me and considered in my medical decision making (  see chart for details).     Plan: 1. Test/x-ray results and diagnosis reviewed with patient 2. rx as per orders; risks, benefits, potential side effects reviewed with patient 3. Recommend supportive treatment with eating a warm compress washing program 3 times daily.  She will dry the area thoroughly and apply Bactroban ointment.  No further drain is placed today in hopes of the wound completely closing in the next few days to week.  The area with a dry dressing.  We will see her one final time in 3 days hopefully no other intervention needs to be performed.  If she is doing well at that visit we will see her then on a as needed basis 4. F/u prn if symptoms worsen or don't improve   Final Clinical Impressions(s) / UC Diagnoses   Final diagnoses:  Encounter for recheck of abscess following incision and drainage    ED Discharge Orders    None       Controlled Substance Prescriptions [place] Controlled Substance Registry consulted? Not Applicable   Lorin Picket, PA-C 12/24/17 1250

## 2017-12-24 NOTE — ED Triage Notes (Signed)
Pt here for wound check. Has been in 6 times for abscess I&D, then repacking each time. Pt reports it is still draining. Denies pain

## 2018-01-12 DIAGNOSIS — M25511 Pain in right shoulder: Secondary | ICD-10-CM | POA: Diagnosis not present

## 2018-01-16 DIAGNOSIS — E11319 Type 2 diabetes mellitus with unspecified diabetic retinopathy without macular edema: Secondary | ICD-10-CM | POA: Diagnosis not present

## 2018-01-16 DIAGNOSIS — G8929 Other chronic pain: Secondary | ICD-10-CM | POA: Diagnosis not present

## 2018-01-16 DIAGNOSIS — M25511 Pain in right shoulder: Secondary | ICD-10-CM | POA: Diagnosis not present

## 2018-01-16 DIAGNOSIS — M7541 Impingement syndrome of right shoulder: Secondary | ICD-10-CM | POA: Diagnosis not present

## 2018-02-26 DIAGNOSIS — J449 Chronic obstructive pulmonary disease, unspecified: Secondary | ICD-10-CM | POA: Insufficient documentation

## 2018-02-27 DIAGNOSIS — Z79899 Other long term (current) drug therapy: Secondary | ICD-10-CM | POA: Diagnosis not present

## 2018-02-27 DIAGNOSIS — E11319 Type 2 diabetes mellitus with unspecified diabetic retinopathy without macular edema: Secondary | ICD-10-CM | POA: Diagnosis not present

## 2018-02-27 DIAGNOSIS — E782 Mixed hyperlipidemia: Secondary | ICD-10-CM | POA: Diagnosis not present

## 2018-03-14 ENCOUNTER — Ambulatory Visit
Admission: RE | Admit: 2018-03-14 | Discharge: 2018-03-14 | Disposition: A | Discharge status: Home / Self Care | Payer: 59 | Source: Ambulatory Visit | Attending: Internal Medicine | Admitting: Internal Medicine

## 2018-03-14 DIAGNOSIS — Z1231 Encounter for screening mammogram for malignant neoplasm of breast: Secondary | ICD-10-CM | POA: Diagnosis not present

## 2018-04-01 ENCOUNTER — Ambulatory Visit
Admission: EM | Admit: 2018-04-01 | Discharge: 2018-04-01 | Disposition: A | Discharge status: Home / Self Care | Payer: 59 | Attending: Family Medicine | Admitting: Family Medicine

## 2018-04-01 ENCOUNTER — Other Ambulatory Visit: Payer: Self-pay

## 2018-04-01 DIAGNOSIS — R062 Wheezing: Secondary | ICD-10-CM | POA: Diagnosis not present

## 2018-04-01 DIAGNOSIS — R0602 Shortness of breath: Secondary | ICD-10-CM | POA: Diagnosis not present

## 2018-04-01 DIAGNOSIS — R05 Cough: Secondary | ICD-10-CM

## 2018-04-01 MED ORDER — IPRATROPIUM-ALBUTEROL 0.5-2.5 (3) MG/3ML IN SOLN
3.0000 mL | Freq: Four times a day (QID) | RESPIRATORY_TRACT | Status: DC
  Administered 2018-04-01: 3 mL via RESPIRATORY_TRACT

## 2018-04-01 MED ORDER — PREDNISONE 50 MG PO TABS: ORAL_TABLET | ORAL | 0 refills | Status: DC

## 2018-04-01 MED ORDER — ALBUTEROL SULFATE (2.5 MG/3ML) 0.083% IN NEBU
2.5000 mg | INHALATION_SOLUTION | Freq: Once | RESPIRATORY_TRACT | Status: DC
Start: 2018-04-01 — End: 2018-04-01

## 2018-04-01 MED ORDER — METHYLPREDNISOLONE SODIUM SUCC 40 MG IJ SOLR
80.0000 mg | Freq: Once | INTRAMUSCULAR | Status: AC
  Administered 2018-04-01: 80 mg via INTRAMUSCULAR

## 2018-04-01 MED ORDER — ALBUTEROL SULFATE (5 MG/ML) 0.5% IN NEBU: 2.5000 mg | INHALATION_SOLUTION | Freq: Four times a day (QID) | RESPIRATORY_TRACT | 12 refills | Status: DC | PRN

## 2018-04-01 NOTE — ED Provider Notes (Signed)
[facility]    CSN: [id number] Arrival date & time: 04/01/18  1517  History   Chief Complaint Chief Complaint  Patient presents with  . Wheezing   HPI  57 year old female presents with wheezing, cough, shortness of breath.  Patient reports a 1.5-week history of wheezing, shortness of breath, productive cough.  Patient states that she has seen pulmonology.  Her most recent pulmonology note, suspected COPD/reactive airway disease.  Restriction noted on PFTs as well.  Patient reports that she continues to use her inhalers.  She has not had any improvement.  She thinks that her symptoms are brought about by exposures at work.  No fever.  No known relieving factors.  No other reported symptoms.  No other complaints.  Past Medical History:  Diagnosis Date  . Bilateral carpal tunnel syndrome   . Degenerative disc disease, cervical   . Degenerative disc disease, lumbar   . Depression   . Diabetes mellitus type 2 with retinopathy ([place])   . GERD (gastroesophageal reflux disease)   . History of herpes genitalis   . Hyperlipidemia   . Hypertension   . Obesity   . Osteoarthritis of both knees   . Seasonal rhinitis   . Wears dentures    Past Surgical History:  Procedure Laterality Date  . CARPAL TUNNEL RELEASE Left 04/09/2016   Procedure: LEFT OPEN CARPAL TUNNEL RELEASE;  Surgeon: Leanor Kail, MD;  Location: [place];  Service: Orthopedics;  Laterality: Left;  . COLONOSCOPY    . FLEXIBLE SIGMOIDOSCOPY  2001  . NOSE SURGERY  1994  . open carpal tunnel release Right 2015   OB History   None    Home Medications    Prior to Admission medications   Medication Sig Start Date End Date Taking? Authorizing Provider  albuterol (PROVENTIL) (5 MG/ML) 0.5% nebulizer solution Take 0.5 mLs (2.5 mg total) by nebulization every 6 (six) hours as needed for wheezing or shortness of breath. 04/01/18   [name], DO  benazepril (LOTENSIN) 40 MG tablet Take 40 mg by mouth  daily.    [provider]  citalopram (CELEXA) 40 MG tablet Take 40 mg by mouth daily.    [provider]  cyclobenzaprine (FLEXERIL) 10 MG tablet Take 10 mg by mouth 3 (three) times daily as needed for muscle spasms.    [provider]  estradiol (ESTRACE) 0.5 MG tablet Take 0.5 mg by mouth daily.    [provider]  etodolac (LODINE) 400 MG tablet Take 400 mg by mouth 2 (two) times daily.    [provider]  gabapentin (NEURONTIN) 300 MG capsule Take 300-600 mg by mouth at bedtime as needed.    [provider]  glimepiride (AMARYL) 2 MG tablet Take 2 mg by mouth daily with breakfast.    [provider]  ibuprofen (ADVIL,MOTRIN) 200 MG tablet Take 400-800 mg by mouth every 8 (eight) hours as needed.    [provider]  medroxyPROGESTERone (PROVERA) 2.5 MG tablet Take 2.5 mg by mouth daily.    [provider]  metFORMIN (GLUCOPHAGE-XR) 500 MG 24 hr tablet Take 1,000 mg by mouth 2 (two) times daily with a meal.    [provider]  omeprazole (PRILOSEC OTC) 20 MG tablet Take 20 mg by mouth daily.    [provider]  potassium chloride SA (K-DUR,KLOR-CON) 20 MEQ tablet Take 20 mEq by mouth 3 (three) times daily.    [provider]  predniSONE (DELTASONE) 50 MG  tablet 1 tablet daily x 5 days. 04/02/18   [name], DO  triamterene-hydrochlorothiazide (MAXZIDE-25) 37.5-25 MG tablet Take 1 tablet by mouth daily.    [provider]  zolpidem (AMBIEN) 10 MG tablet Take 10 mg by mouth at bedtime as needed for sleep.    [provider]    Family History Family History  Problem Relation Age of Onset  . Heart attack Mother   . Diabetes Mother   . Hypertension Mother   . Stroke Father   . Hypertension Father   . Diabetes Father   . Breast cancer Neg Hx     Social History Social History   Tobacco Use  . Smoking status: Former Smoker    Last attempt to quit: 1997     Years since quitting: 22.4  . Smokeless tobacco: Never Used  Substance Use Topics  . Alcohol use: No  . Drug use: No     Allergies   Patient has no known allergies.   Review of Systems Review of Systems  Constitutional: Negative for diaphoresis and fever.  Respiratory: Positive for cough, shortness of breath and wheezing.    Physical Exam Triage Vital Signs ED Triage Vitals  Enc Vitals Group     BP 04/01/18 1602 (!) 154/95     Pulse Rate 04/01/18 1602 98     Resp 04/01/18 1602 (!) 24     Temp 04/01/18 1602 98.4 F (36.9 C)     Temp Source 04/01/18 1602 Oral     SpO2 04/01/18 1602 98 %     Weight 04/01/18 1605 240 lb (108.9 kg)     Height 04/01/18 1605 5\' 6"  (1.676 m)     Head Circumference --      Peak Flow --      Pain Score 04/01/18 1601 0     Pain Loc --      Pain Edu? --      Excl. in [place]? --    Updated Vital Signs BP (!) 154/95 (BP Location: Left Arm)   Pulse 98   Temp 98.4 F (36.9 C) (Oral)   Resp (!) 24   Ht 5\' 6"  (1.676 m)   Wt 240 lb (108.9 kg)   SpO2 98%   BMI 38.74 kg/m  Physical Exam  Constitutional: She is oriented to person, place, and time. She appears well-developed.  Mild increased work of breathing.  HENT:  Head: Normocephalic and atraumatic.  Mouth/Throat: Oropharynx is clear and moist.  Cardiovascular: Normal rate and regular rhythm.  Pulmonary/Chest:  Mild increased work of breathing.  Diffuse expiratory wheezing.  Neurological: She is alert and oriented to person, place, and time.  Psychiatric: She has a normal mood and affect. Her behavior is normal.  Nursing note and vitals reviewed.  UC Treatments / Results  Labs (all labs ordered are listed, but only abnormal results are displayed) Labs Reviewed - No data to display  EKG None  Radiology No results found.  Procedures Procedures (including critical care time)  Medications Ordered in UC Medications  ipratropium-albuterol (DUONEB) 0.5-2.5 (3) MG/3ML nebulizer solution  3 mL (3 mLs Nebulization Given 04/01/18 1611)  methylPREDNISolone sodium succinate (SOLU-MEDROL) 40 mg/mL injection 80 mg (80 mg Intramuscular Given 04/01/18 1629)    Initial Impression / Assessment and Plan / UC Course  I have reviewed the triage vital signs and the nursing notes.  Pertinent labs & imaging results that were available during my care of the patient were reviewed by me and  considered in my medical decision making (see chart for details).    57 year old female presents with shortness of breath and wheezing.  Questionable underlying asthma or COPD.  Treated with prednisone.  IM Solu-Medrol given today.  Also sending home with handwritten prescription for nebulizer machine.  Sending in albuterol.  Final Clinical Impressions(s) / UC Diagnoses   Final diagnoses:  Wheezing  SOB (shortness of breath)     Discharge Instructions     Medications as prescribed.  If you worsen, go to the hospital. Don't wait.  Follow up with your pulmonologist.  Take care  Dr. Lacinda Axon    ED Prescriptions    Medication Sig [place]. Provider   albuterol (PROVENTIL) (5 MG/ML) 0.5% nebulizer solution Take 0.5 mLs (2.5 mg total) by nebulization every 6 (six) hours as needed for wheezing or shortness of breath. 20 mL Ermagene Saidi G, DO   predniSONE (DELTASONE) 50 MG tablet 1 tablet daily x 5 days. 5 tablet [name], DO     Controlled Substance Prescriptions [place] Controlled Substance Registry consulted? Not Applicable   [name], DO 04/01/18 1726

## 2018-04-01 NOTE — ED Triage Notes (Addendum)
Pt reports wheezing x one week.

## 2018-04-01 NOTE — Discharge Instructions (Signed)
Medications as prescribed.  If you worsen, go to the hospital. Don't wait.  Follow up with your pulmonologist.  Take care  Dr. Lacinda Axon

## 2018-04-10 DIAGNOSIS — R0609 Other forms of dyspnea: Secondary | ICD-10-CM | POA: Diagnosis not present

## 2018-04-10 DIAGNOSIS — G4733 Obstructive sleep apnea (adult) (pediatric): Secondary | ICD-10-CM | POA: Diagnosis not present

## 2018-04-10 DIAGNOSIS — J449 Chronic obstructive pulmonary disease, unspecified: Secondary | ICD-10-CM | POA: Diagnosis not present

## 2018-06-05 DIAGNOSIS — J449 Chronic obstructive pulmonary disease, unspecified: Secondary | ICD-10-CM | POA: Diagnosis not present

## 2018-06-17 ENCOUNTER — Encounter: Payer: Self-pay | Admitting: Gynecology

## 2018-06-17 ENCOUNTER — Other Ambulatory Visit: Payer: Self-pay

## 2018-06-17 ENCOUNTER — Ambulatory Visit
Admission: EM | Admit: 2018-06-17 | Discharge: 2018-06-17 | Disposition: A | Discharge status: Home / Self Care | Payer: 59 | Attending: Family Medicine | Admitting: Family Medicine

## 2018-06-17 DIAGNOSIS — B9789 Other viral agents as the cause of diseases classified elsewhere: Secondary | ICD-10-CM

## 2018-06-17 DIAGNOSIS — I10 Essential (primary) hypertension: Secondary | ICD-10-CM

## 2018-06-17 DIAGNOSIS — J029 Acute pharyngitis, unspecified: Secondary | ICD-10-CM | POA: Diagnosis not present

## 2018-06-17 DIAGNOSIS — H6502 Acute serous otitis media, left ear: Secondary | ICD-10-CM | POA: Diagnosis not present

## 2018-06-17 DIAGNOSIS — R05 Cough: Secondary | ICD-10-CM | POA: Diagnosis not present

## 2018-06-17 DIAGNOSIS — J069 Acute upper respiratory infection, unspecified: Secondary | ICD-10-CM | POA: Diagnosis not present

## 2018-06-17 LAB — RAPID STREP SCREEN (MED CTR MEBANE ONLY): Streptococcus, Group A Screen (Direct): NEGATIVE

## 2018-06-17 MED ORDER — AMOXICILLIN 875 MG PO TABS: 875.0000 mg | ORAL_TABLET | Freq: Two times a day (BID) | ORAL | 0 refills | Status: DC

## 2018-06-17 MED ORDER — ACETAMINOPHEN 325 MG PO TABS
650.0000 mg | ORAL_TABLET | Freq: Once | ORAL | Status: AC
  Administered 2018-06-17: 650 mg via ORAL

## 2018-06-17 NOTE — ED Provider Notes (Signed)
[facility]    CSN: [id number] Arrival date & time: 06/17/18  0801     History   Chief Complaint Chief Complaint  Patient presents with  . Sore Throat    HPI Debbie Powers is a 57 y.o. female.   The history is provided by the patient.  Sore Throat  Associated symptoms include headaches.  URI  Presenting symptoms: congestion, ear pain, rhinorrhea and sore throat   Severity:  Moderate Onset quality:  Sudden Duration:  4 days Timing:  Constant Progression:  Worsening Chronicity:  New Relieved by:  Nothing Ineffective treatments:  OTC medications Associated symptoms: headaches and myalgias   Associated symptoms: no wheezing   Risk factors: diabetes mellitus and sick contacts   Risk factors: not elderly, no chronic cardiac disease, no chronic kidney disease, no chronic respiratory disease, no immunosuppression, no recent illness and no recent travel     Past Medical History:  Diagnosis Date  . Bilateral carpal tunnel syndrome   . Degenerative disc disease, cervical   . Degenerative disc disease, lumbar   . Depression   . Diabetes mellitus type 2 with retinopathy (Debbie Powers)   . GERD (gastroesophageal reflux disease)   . History of herpes genitalis   . Hyperlipidemia   . Hypertension   . Obesity   . Osteoarthritis of both knees   . Seasonal rhinitis   . Wears dentures     There are no active problems to display for this patient.   Past Surgical History:  Procedure Laterality Date  . CARPAL TUNNEL RELEASE Left 04/09/2016   Procedure: LEFT OPEN CARPAL TUNNEL RELEASE;  Surgeon: Leanor Kail, MD;  Location: [place];  Service: Orthopedics;  Laterality: Left;  . COLONOSCOPY    . FLEXIBLE SIGMOIDOSCOPY  2001  . NOSE SURGERY  1994  . open carpal tunnel release Right 2015    OB History   None      Home Medications    Prior to Admission medications   Medication Sig Start Date End Date Taking? Authorizing Provider  albuterol  (PROVENTIL) (5 MG/ML) 0.5% nebulizer solution Take 0.5 mLs (2.5 mg total) by nebulization every 6 (six) hours as needed for wheezing or shortness of breath. 04/01/18  Yes Cook, Jayce G, DO  benazepril (LOTENSIN) 40 MG tablet Take 40 mg by mouth daily.   Yes [provider]  citalopram (CELEXA) 40 MG tablet Take 40 mg by mouth daily.   Yes [provider]  cyclobenzaprine (FLEXERIL) 10 MG tablet Take 10 mg by mouth 3 (three) times daily as needed for muscle spasms.   Yes [provider]  estradiol (ESTRACE) 0.5 MG tablet Take 0.5 mg by mouth daily.   Yes [provider]  etodolac (LODINE) 400 MG tablet Take 400 mg by mouth 2 (two) times daily.   Yes [provider]  gabapentin (NEURONTIN) 300 MG capsule Take 300-600 mg by mouth at bedtime as needed.   Yes [provider]  glimepiride (AMARYL) 2 MG tablet Take 2 mg by mouth daily with breakfast.   Yes [provider]  ibuprofen (ADVIL,MOTRIN) 200 MG tablet Take 400-800 mg by mouth every 8 (eight) hours as needed.   Yes [provider]  medroxyPROGESTERone (PROVERA) 2.5 MG tablet Take 2.5 mg by mouth daily.   Yes [provider]  metFORMIN (GLUCOPHAGE-XR) 500 MG 24 hr tablet Take 1,000 mg by mouth 2 (two) times daily with a meal.   Yes [provider]  omeprazole (PRILOSEC  OTC) 20 MG tablet Take 20 mg by mouth daily.   Yes [provider]  potassium chloride SA (K-DUR,KLOR-CON) 20 MEQ tablet Take 20 mEq by mouth 3 (three) times daily.   Yes [provider]  triamterene-hydrochlorothiazide (MAXZIDE-25) 37.5-25 MG tablet Take 1 tablet by mouth daily.   Yes [provider]  zolpidem (AMBIEN) 10 MG tablet Take 10 mg by mouth at bedtime as needed for sleep.   Yes [provider]  amoxicillin (AMOXIL) 875 MG tablet Take 1 tablet (875 mg total) by mouth 2 (two) times daily. 06/17/18   [name], MD  predniSONE (DELTASONE) 50 MG  tablet 1 tablet daily x 5 days. 04/02/18   [name], DO    Family History Family History  Problem Relation Age of Onset  . Heart attack Mother   . Diabetes Mother   . Hypertension Mother   . Stroke Father   . Hypertension Father   . Diabetes Father   . Breast cancer Neg Hx     Social History Social History   Tobacco Use  . Smoking status: Former Smoker    Last attempt to quit: 1997    Years since quitting: 22.6  . Smokeless tobacco: Never Used  Substance Use Topics  . Alcohol use: No  . Drug use: No     Allergies   Patient has no known allergies.   Review of Systems Review of Systems  HENT: Positive for congestion, ear pain, rhinorrhea and sore throat.   Respiratory: Negative for wheezing.   Musculoskeletal: Positive for myalgias.  Neurological: Positive for headaches.     Physical Exam Triage Vital Signs ED Triage Vitals  Enc Vitals Group     BP 06/17/18 0818 (!) 159/105     Pulse Rate 06/17/18 0818 (!) 107     Resp 06/17/18 0818 16     Temp 06/17/18 0818 (!) 101 F (38.3 C)     Temp Source 06/17/18 0818 Oral     SpO2 06/17/18 0818 98 %     Weight 06/17/18 0819 235 lb (106.6 kg)     Height 06/17/18 0819 5\' 6"  (1.676 m)     Head Circumference --      Peak Flow --      Pain Score 06/17/18 0819 10     Pain Loc --      Pain Edu? --      Excl. in [employer]? --    No data found.  Updated Vital Signs BP (!) 159/105 (BP Location: Left Arm)   Pulse (!) 107   Temp (!) 101 F (38.3 C) (Oral)   Resp 16   Ht 5\' 6"  (1.676 m)   Wt 106.6 kg   SpO2 98%   BMI 37.93 kg/m   Visual Acuity Right Eye Distance:   Left Eye Distance:   Bilateral Distance:    Right Eye Near:   Left Eye Near:    Bilateral Near:     Physical Exam  Constitutional: She appears well-developed and well-nourished. No distress.  HENT:  Head: Normocephalic and atraumatic.  Right Ear: Tympanic membrane, external ear and ear canal normal.  Left Ear: External ear and ear canal normal.  Tympanic membrane is erythematous and bulging. A middle ear effusion is present.  Nose: Mucosal edema and rhinorrhea present. No nose lacerations, sinus tenderness, nasal deformity, septal deviation or nasal septal hematoma. No epistaxis.  No foreign bodies. Right sinus exhibits no maxillary sinus tenderness and no frontal sinus tenderness.  Left sinus exhibits no maxillary sinus tenderness and no frontal sinus tenderness.  Mouth/Throat: Uvula is midline, oropharynx is clear and moist and mucous membranes are normal. No oropharyngeal exudate.  Eyes: Pupils are equal, round, and reactive to light. Conjunctivae and EOM are normal. Right eye exhibits no discharge. Left eye exhibits no discharge. No scleral icterus.  Neck: Normal range of motion. Neck supple. No thyromegaly present.  Cardiovascular: Normal rate, regular rhythm and normal heart sounds.  Pulmonary/Chest: Effort normal and breath sounds normal. No stridor. No respiratory distress. She has no wheezes. She has no rales.  Lymphadenopathy:    She has no cervical adenopathy.  Skin: She is not diaphoretic.  Nursing note and vitals reviewed.    UC Treatments / Results  Labs (all labs ordered are listed, but only abnormal results are displayed) Labs Reviewed  RAPID STREP SCREEN ([facility] ONLY)  CULTURE, GROUP A STREP [facility])    EKG None  Radiology No results found.  Procedures Procedures (including critical care time)  Medications Ordered in UC Medications  acetaminophen (TYLENOL) tablet 650 mg (650 mg Oral Given 06/17/18 0831)    Initial Impression / Assessment and Plan / UC Course  I have reviewed the triage vital signs and the nursing notes.  Pertinent labs & imaging results that were available during my care of the patient were reviewed by me and considered in my medical decision making (see chart for details).      Final Clinical Impressions(s) / UC Diagnoses   Final diagnoses:  Viral URI with cough  Acute  serous otitis media of left ear, recurrence not specified   Discharge Instructions   None    ED Prescriptions    Medication Sig Dispense Auth. Provider   amoxicillin (AMOXIL) 875 MG tablet Take 1 tablet (875 mg total) by mouth 2 (two) times daily. 20 tablet [name], MD     1. Lab results and diagnosis reviewed with patient 2. rx as per orders above; reviewed possible side effects, interactions, risks and benefits  3. Recommend supportive treatment with rest, fluids, otc analgesics prn 4. Follow-up prn if symptoms worsen or don't improve   Controlled Substance Prescriptions [place] Controlled Substance Registry consulted? Not Applicable   [name], MD 06/17/18 [phone]

## 2018-06-17 NOTE — ED Triage Notes (Signed)
Patient c/o sore throat x 4 days. Patient c/o neck and left ear pain

## 2018-06-20 LAB — CULTURE, GROUP A STREP (THRC)

## 2018-07-05 DIAGNOSIS — M5030 Other cervical disc degeneration, unspecified cervical region: Secondary | ICD-10-CM | POA: Diagnosis not present

## 2018-07-05 DIAGNOSIS — M5412 Radiculopathy, cervical region: Secondary | ICD-10-CM | POA: Diagnosis not present

## 2018-07-05 DIAGNOSIS — E11319 Type 2 diabetes mellitus with unspecified diabetic retinopathy without macular edema: Secondary | ICD-10-CM | POA: Diagnosis not present

## 2018-07-12 DIAGNOSIS — Z23 Encounter for immunization: Secondary | ICD-10-CM | POA: Diagnosis not present

## 2018-07-31 DIAGNOSIS — M6281 Muscle weakness (generalized): Secondary | ICD-10-CM | POA: Diagnosis not present

## 2018-07-31 DIAGNOSIS — M5412 Radiculopathy, cervical region: Secondary | ICD-10-CM | POA: Diagnosis not present

## 2018-08-03 DIAGNOSIS — M5412 Radiculopathy, cervical region: Secondary | ICD-10-CM | POA: Diagnosis not present

## 2018-08-03 DIAGNOSIS — M6281 Muscle weakness (generalized): Secondary | ICD-10-CM | POA: Diagnosis not present

## 2018-08-11 DIAGNOSIS — E1169 Type 2 diabetes mellitus with other specified complication: Secondary | ICD-10-CM | POA: Diagnosis not present

## 2018-08-11 DIAGNOSIS — E11319 Type 2 diabetes mellitus with unspecified diabetic retinopathy without macular edema: Secondary | ICD-10-CM | POA: Diagnosis not present

## 2018-08-11 DIAGNOSIS — Z79899 Other long term (current) drug therapy: Secondary | ICD-10-CM | POA: Diagnosis not present

## 2018-08-11 DIAGNOSIS — E785 Hyperlipidemia, unspecified: Secondary | ICD-10-CM | POA: Diagnosis not present

## 2018-08-14 DIAGNOSIS — M5412 Radiculopathy, cervical region: Secondary | ICD-10-CM | POA: Diagnosis not present

## 2018-08-17 DIAGNOSIS — M5412 Radiculopathy, cervical region: Secondary | ICD-10-CM | POA: Diagnosis not present

## 2018-08-23 ENCOUNTER — Other Ambulatory Visit: Payer: Self-pay | Admitting: Orthopedic Surgery

## 2018-08-23 DIAGNOSIS — M5030 Other cervical disc degeneration, unspecified cervical region: Secondary | ICD-10-CM

## 2018-08-23 DIAGNOSIS — M5412 Radiculopathy, cervical region: Secondary | ICD-10-CM

## 2018-09-20 ENCOUNTER — Ambulatory Visit
Admission: RE | Admit: 2018-09-20 | Discharge: 2018-09-20 | Disposition: A | Discharge status: Home / Self Care | Payer: 59 | Source: Ambulatory Visit | Attending: Orthopedic Surgery | Admitting: Orthopedic Surgery

## 2018-09-20 DIAGNOSIS — M5030 Other cervical disc degeneration, unspecified cervical region: Secondary | ICD-10-CM | POA: Diagnosis not present

## 2018-09-20 DIAGNOSIS — M4802 Spinal stenosis, cervical region: Secondary | ICD-10-CM | POA: Insufficient documentation

## 2018-09-20 DIAGNOSIS — M5412 Radiculopathy, cervical region: Secondary | ICD-10-CM | POA: Diagnosis not present

## 2018-09-20 DIAGNOSIS — M2578 Osteophyte, vertebrae: Secondary | ICD-10-CM | POA: Insufficient documentation

## 2018-09-20 DIAGNOSIS — M542 Cervicalgia: Secondary | ICD-10-CM | POA: Diagnosis not present

## 2018-09-21 DIAGNOSIS — J060 Acute laryngopharyngitis: Secondary | ICD-10-CM | POA: Diagnosis not present

## 2018-09-21 DIAGNOSIS — E1165 Type 2 diabetes mellitus with hyperglycemia: Secondary | ICD-10-CM | POA: Diagnosis not present

## 2018-09-21 DIAGNOSIS — Z79899 Other long term (current) drug therapy: Secondary | ICD-10-CM | POA: Diagnosis not present

## 2018-10-16 DIAGNOSIS — M5412 Radiculopathy, cervical region: Secondary | ICD-10-CM | POA: Diagnosis not present

## 2018-10-16 DIAGNOSIS — M5030 Other cervical disc degeneration, unspecified cervical region: Secondary | ICD-10-CM | POA: Diagnosis not present

## 2018-10-19 DIAGNOSIS — Z79899 Other long term (current) drug therapy: Secondary | ICD-10-CM | POA: Diagnosis not present

## 2018-10-19 DIAGNOSIS — E1165 Type 2 diabetes mellitus with hyperglycemia: Secondary | ICD-10-CM | POA: Diagnosis not present

## 2018-12-11 DIAGNOSIS — E1165 Type 2 diabetes mellitus with hyperglycemia: Secondary | ICD-10-CM | POA: Diagnosis not present

## 2018-12-11 DIAGNOSIS — Z79899 Other long term (current) drug therapy: Secondary | ICD-10-CM | POA: Diagnosis not present

## 2018-12-19 DIAGNOSIS — M5412 Radiculopathy, cervical region: Secondary | ICD-10-CM | POA: Diagnosis not present

## 2018-12-19 DIAGNOSIS — M5030 Other cervical disc degeneration, unspecified cervical region: Secondary | ICD-10-CM | POA: Diagnosis not present

## 2019-01-01 DIAGNOSIS — R0609 Other forms of dyspnea: Secondary | ICD-10-CM | POA: Diagnosis not present

## 2019-01-01 DIAGNOSIS — J449 Chronic obstructive pulmonary disease, unspecified: Secondary | ICD-10-CM | POA: Diagnosis not present

## 2019-01-01 DIAGNOSIS — E663 Overweight: Secondary | ICD-10-CM | POA: Diagnosis not present

## 2019-01-07 IMAGING — MG MM DIGITAL SCREENING BILAT W/ CAD
5 series · 5 of 5 positions shown · non-contrast
Comparison: Previous exam(s).

CLINICAL DATA: Screening.

EXAM:
DIGITAL SCREENING BILATERAL MAMMOGRAM WITH CAD

[L MLO]
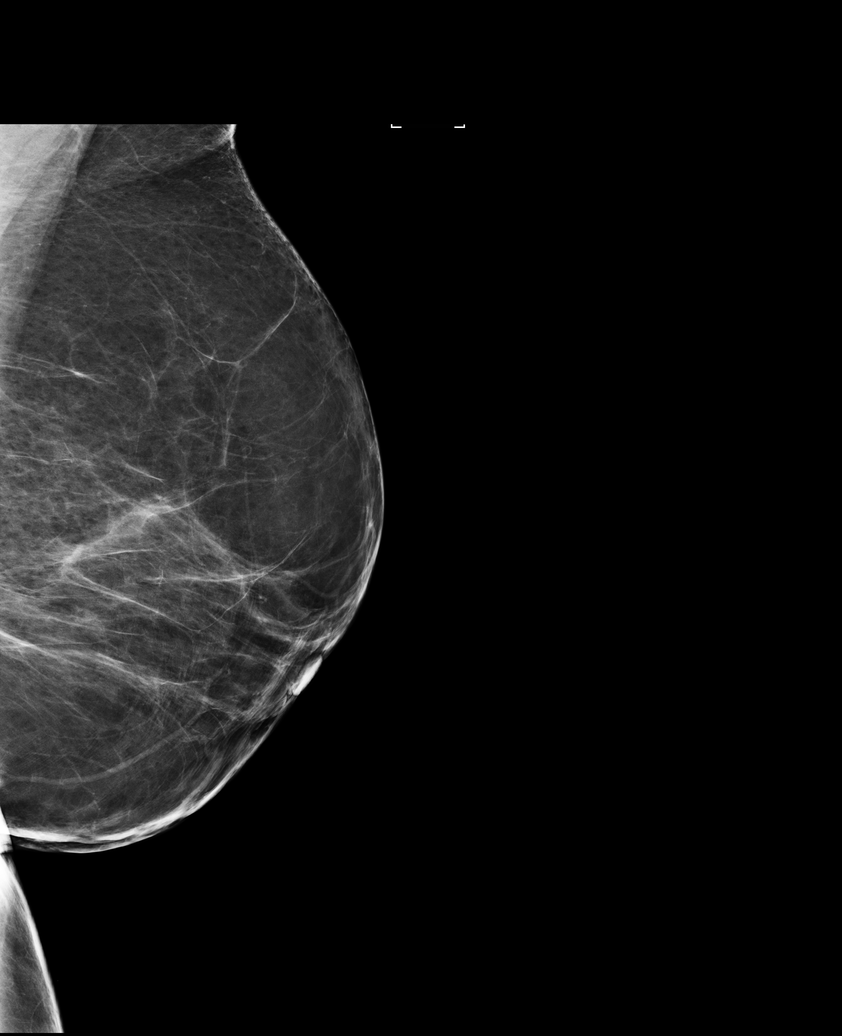

[R CC (1 of 2)]
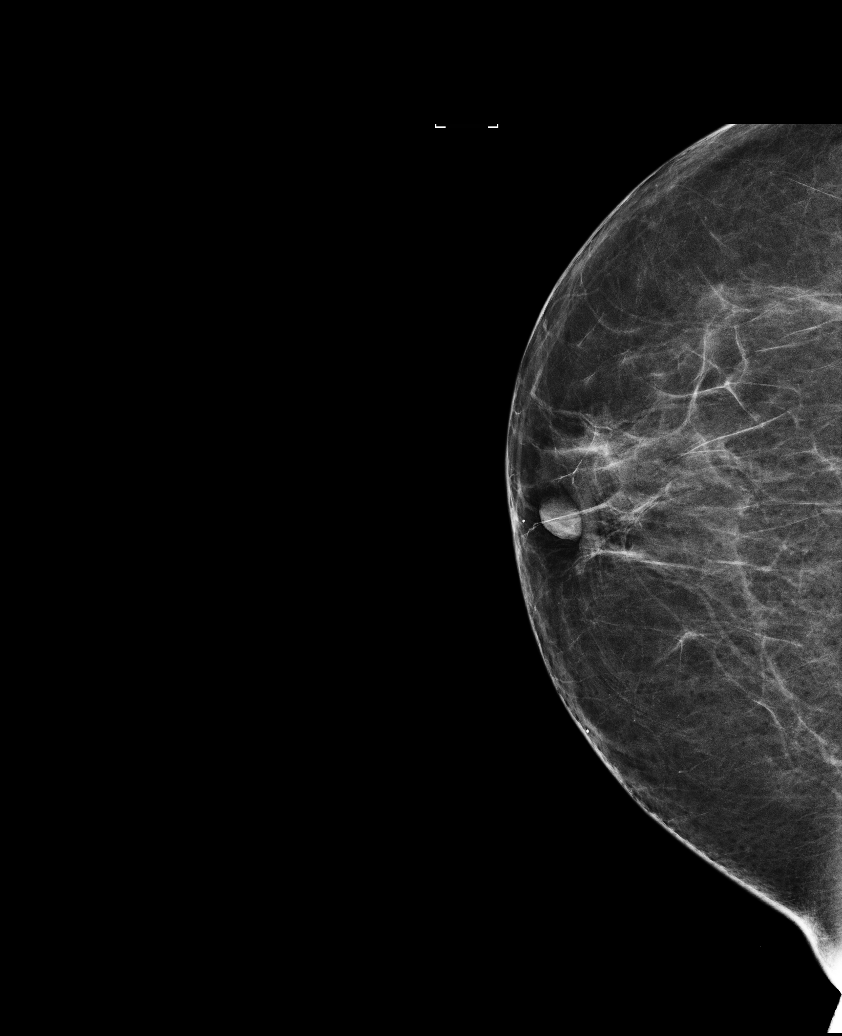

[R CC (2 of 2)]
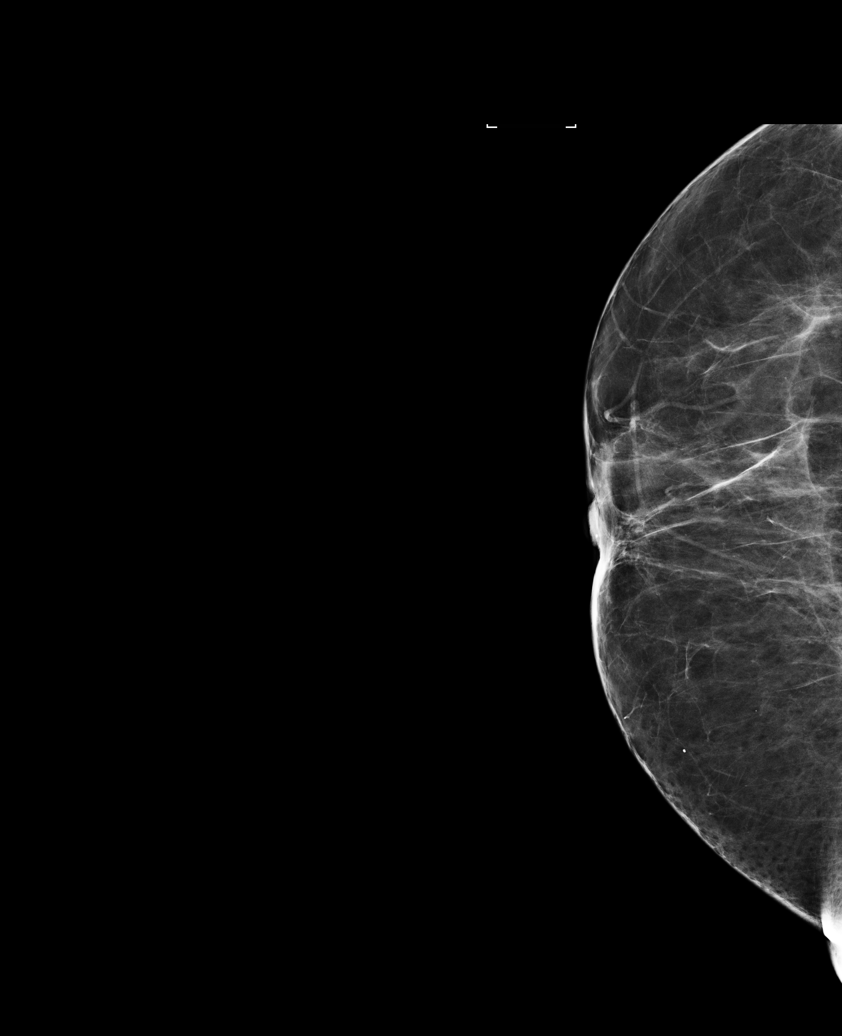

[R MLO]
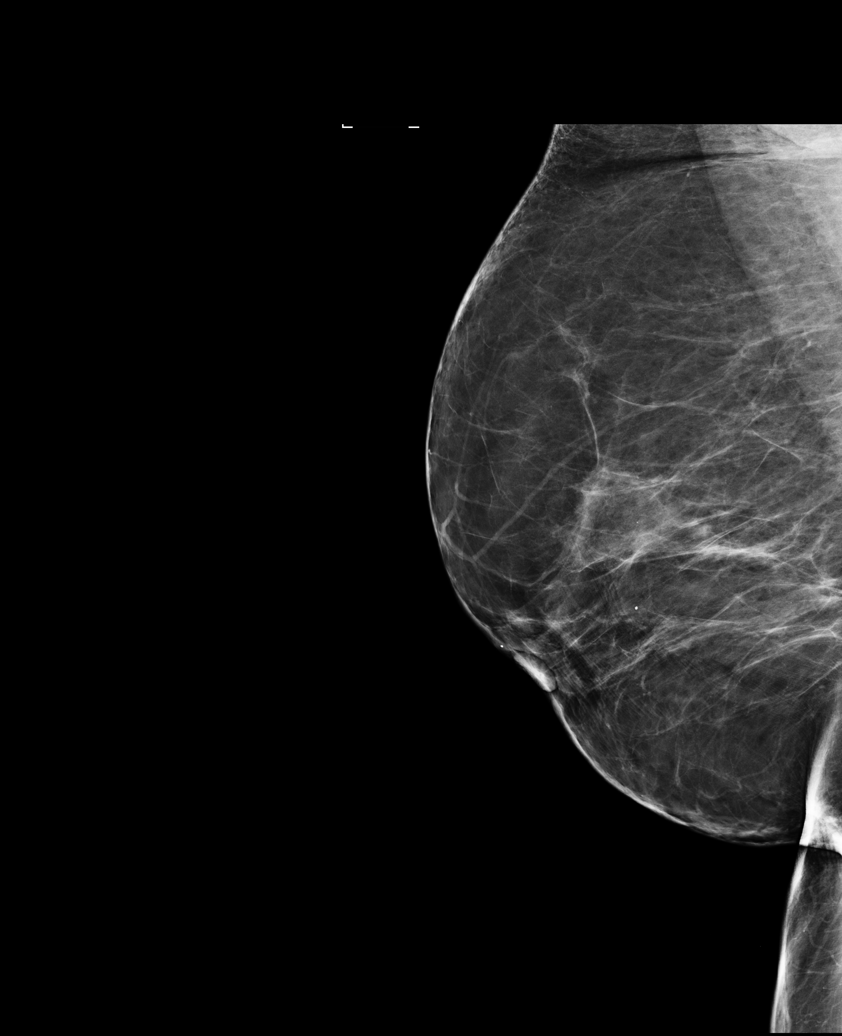

[L CC]
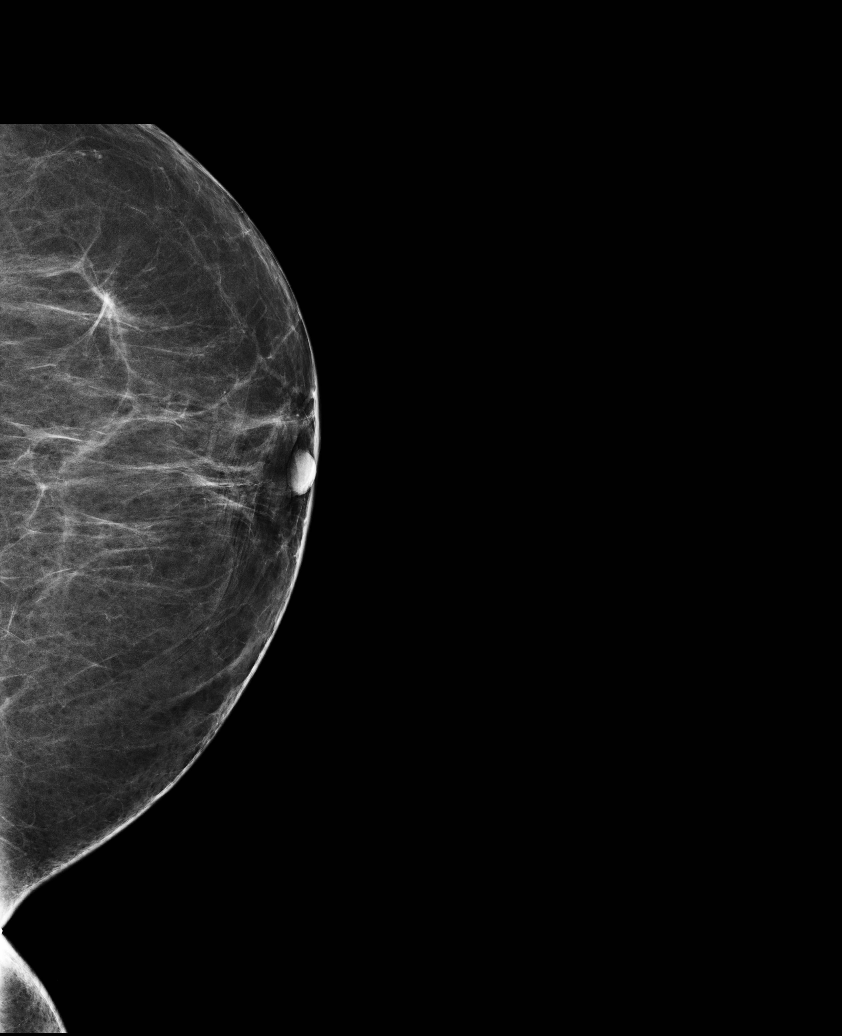

[5 of 5 positions shown; findings below may reference images not displayed]

ACR Breast Density Category b: There are scattered areas of
fibroglandular density.
FINDINGS: There are no findings suspicious for malignancy. Images were
processed with CAD.
IMPRESSION: No mammographic evidence of malignancy. A result letter of this
screening mammogram will be mailed directly to the patient.

RECOMMENDATION:
Screening mammogram in one year. (Code:AS-G-LCT)

BI-RADS CATEGORY  1: Negative.

## 2019-01-11 DIAGNOSIS — E11319 Type 2 diabetes mellitus with unspecified diabetic retinopathy without macular edema: Secondary | ICD-10-CM | POA: Diagnosis not present

## 2019-01-11 DIAGNOSIS — E1169 Type 2 diabetes mellitus with other specified complication: Secondary | ICD-10-CM | POA: Diagnosis not present

## 2019-01-11 DIAGNOSIS — Z794 Long term (current) use of insulin: Secondary | ICD-10-CM | POA: Diagnosis not present

## 2019-01-11 DIAGNOSIS — Z79899 Other long term (current) drug therapy: Secondary | ICD-10-CM | POA: Diagnosis not present

## 2019-01-17 ENCOUNTER — Other Ambulatory Visit: Payer: Self-pay

## 2019-01-17 ENCOUNTER — Emergency Department: Admission: EM | Admit: 2019-01-17 | Payer: 59 | Source: Home / Self Care

## 2019-01-17 ENCOUNTER — Emergency Department: Payer: 59

## 2019-01-17 DIAGNOSIS — R05 Cough: Secondary | ICD-10-CM | POA: Diagnosis not present

## 2019-01-17 DIAGNOSIS — M791 Myalgia, unspecified site: Secondary | ICD-10-CM | POA: Diagnosis not present

## 2019-01-17 HISTORY — DX: Unspecified asthma, uncomplicated: J45.909

## 2019-01-17 NOTE — ED Notes (Signed)
Pt upset as she meant to go to [place] clinic tent.

## 2019-01-17 NOTE — ED Provider Notes (Signed)
Patient was sent to ED triage mistakenly.  Patient was to be seen at [facility] for outpatient testing.  Given the patient's signs and symptoms, patient was advised to present to the emergency department after screening by the point entry triage personnel at the entrance of the property.  Patient states that she is not to be seen in the emergency department, she was to have outpatient testing that has been already ordered by her primary care.  Patient was evaluated by myself at this point before becoming aware that this was an apparent mistake.  As such, patient will not have her visit completed here at the ED.  No charges for ED visit as she was mistakenly sent to the wrong entrance. Patient is to be redirected to the appropriate entrance for an outpatient test to be performed by her primary care.     [facility] Emergency Department Provider Note  ____________________________________________   First MD Initiated Contact with Patient 01/17/19 1605     (approximate)   I have reviewed the triage vital signs and the nursing notes.   Patient has been triaged with a MSE exam performed by myself at a minimum. Based on symptoms patient may receive a more in-depth exam, labs, imaging as needed based on symptoms and screening exam. Patients have been advised of this setting and exam type at the time of patient interview.  Patient screening is performed during peak COVID-19 times in an emergency triage setting.   HISTORY  Chief Complaint Generalized Body Aches; Cough; and Facial Pain    HPI Debbie Powers is a 58 y.o. female presents to the emergency department with a complaint of fall nasal congestion, mild shortness of breath.. Patient denies any severe headache, neck pain/stiffness, chest pain, significant shortness of breath.  Patient presents the emergency department from her primary care's office where she was evaluated there with a negative influenza test.   Patient denies any fevers or chills, neck pain or stiffness, abdominal pain.  Patient does have a history of asthma but states that the symptoms are different than her normal asthma exacerbation.  Patient states that she has been taking her albuterol inhaler once a day with no significant relief.  She does take Claritin for allergies.  No other medications prior to arrival.  Patient with no signs of significant distress during HPI. Patient has no reported recent high-risk travel, high-risk sick contacts, or significant respiratory symptoms reported. Patient denies any significant shortness of breath or chest pain. Patient's vital signs are stable and within reassuring limits including having no significant tachypnea and no significant tachycardia.  PERC negative.  Low risk for ACS.  No high-risk travel or sick contacts that would suggest an increased risk of COVID-19.  This patient does not meet current CDC criteria for testing currently and I explained that in detail.      Past Medical History:  Diagnosis Date  . Asthma   . Bilateral carpal tunnel syndrome   . Degenerative disc disease, cervical   . Degenerative disc disease, lumbar   . Depression   . Diabetes mellitus type 2 with retinopathy ([place])   . GERD (gastroesophageal reflux disease)   . History of herpes genitalis   . Hyperlipidemia   . Hypertension   . Obesity   . Osteoarthritis of both knees   . Seasonal rhinitis   . Wears dentures     There are no active problems to display for this patient.   Past Surgical History:  Procedure Laterality Date  . CARPAL TUNNEL RELEASE Left 04/09/2016   Procedure: LEFT OPEN CARPAL TUNNEL RELEASE;  Surgeon: Leanor Kail, MD;  Location: [facility];  Service: Orthopedics;  Laterality: Left;  . COLONOSCOPY    . FLEXIBLE SIGMOIDOSCOPY  2001  . NOSE SURGERY  1994  . open carpal tunnel release Right 2015    Prior to Admission medications   Medication Sig Start Date End Date  Taking? Authorizing Provider  albuterol (PROVENTIL) (5 MG/ML) 0.5% nebulizer solution Take 0.5 mLs (2.5 mg total) by nebulization every 6 (six) hours as needed for wheezing or shortness of breath. 04/01/18   [name], DO  amoxicillin (AMOXIL) 875 MG tablet Take 1 tablet (875 mg total) by mouth 2 (two) times daily. 06/17/18   [name], MD  benazepril (LOTENSIN) 40 MG tablet Take 40 mg by mouth daily.    [provider]  citalopram (CELEXA) 40 MG tablet Take 40 mg by mouth daily.    [provider]  cyclobenzaprine (FLEXERIL) 10 MG tablet Take 10 mg by mouth 3 (three) times daily as needed for muscle spasms.    [provider]  estradiol (ESTRACE) 0.5 MG tablet Take 0.5 mg by mouth daily.    [provider]  etodolac (LODINE) 400 MG tablet Take 400 mg by mouth 2 (two) times daily.    [provider]  gabapentin (NEURONTIN) 300 MG capsule Take 300-600 mg by mouth at bedtime as needed.    [provider]  glimepiride (AMARYL) 2 MG tablet Take 2 mg by mouth daily with breakfast.    [provider]  ibuprofen (ADVIL,MOTRIN) 200 MG tablet Take 400-800 mg by mouth every 8 (eight) hours as needed.    [provider]  medroxyPROGESTERone (PROVERA) 2.5 MG tablet Take 2.5 mg by mouth daily.    [provider]  metFORMIN (GLUCOPHAGE-XR) 500 MG 24 hr tablet Take 1,000 mg by mouth 2 (two) times daily with a meal.    [provider]  omeprazole (PRILOSEC OTC) 20 MG tablet Take 20 mg by mouth daily.    [provider]  potassium chloride SA (K-DUR,KLOR-CON) 20 MEQ tablet Take 20 mEq by mouth 3 (three) times daily.    [provider]  predniSONE (DELTASONE) 50 MG tablet 1 tablet daily x 5 days. 04/02/18   [name], DO  triamterene-hydrochlorothiazide (MAXZIDE-25) 37.5-25 MG tablet Take 1 tablet by mouth daily.    [provider]  zolpidem (AMBIEN) 10 MG tablet Take 10 mg by mouth at  bedtime as needed for sleep.    [provider]    Allergies Patient has no known allergies.  Family History  Problem Relation Age of Onset  . Heart attack Mother   . Diabetes Mother   . Hypertension Mother   . Stroke Father   . Hypertension Father   . Diabetes Father   . Breast cancer Neg Hx     Social History Social History   Tobacco Use  . Smoking status: Former Smoker    Last attempt to quit: 1997    Years since quitting: 23.2  . Smokeless tobacco: Never Used  Substance Use Topics  . Alcohol use: No  . Drug use: No    Review of Systems Constitutional: Denies fever ENT: Positive nasal congestion/rhinorhea.  Positive sore throat Cardiovascular: No chest pain. Respiratory: Positive cough.  Mild shortness of breath/difficulty breathing Musculoskeletal: Negative for neck pain nor stiffness. Integumentary: Negative for rash. Neurological: No  focal weakness nor numbness.   ____________________________________________   PHYSICAL EXAM:  VITAL SIGNS: ED Triage Vitals  Enc Vitals Group     BP 01/17/19 1600 (!) 170/98     Pulse Rate 01/17/19 1600 83     Resp --      Temp 01/17/19 1600 99 F (37.2 C)     Temp Source 01/17/19 1600 Oral     SpO2 01/17/19 1600 100 %     Weight 01/17/19 1601 231 lb (104.8 kg)     Height 01/17/19 1601 5\' 6"  (1.676 m)     Head Circumference --      Peak Flow --      Pain Score 01/17/19 1601 7     Pain Loc --      Pain Edu? --      Excl. in [place]? --     Constitutional: Alert and oriented. Generally well appearing and in no acute distress. Eyes: Conjunctivae are normal.  Nose: Moderate congestion/rhinnorhea.  Mildly tender to percussion over the sinuses Mouth: No gross oropharyngeal edema.  No significant erythema/edema Neck: No stridor.  No meningeal signs.   Cardiovascular: Grossly normal heart sounds. Respiratory: Normal respiratory effort without significant tachypnea and no observed retractions. Lungs with a few  scattered expiratory wheezes.  No rales or rhonchi. Musculoskeletal: No gross lower extremity tenderness nor edema. No gross deformities of extremities. Neurologic:  Normal speech and language. No gross focal neurologic deficits are appreciated.  Skin:  Skin is warm, dry and intact. No rash noted.    ____________________________________________   LABS (all labs ordered are listed, but only abnormal results are displayed)  Labs Reviewed - No data to display  ____________________________________________   RADIOLOGY I, Charline Bills Cuthriell, personally viewed and evaluated these images (plain radiographs) as part of my medical decision making, as well as reviewing the written report by the radiologist.  Official radiology report(s): No results found.  ____________________________________________    INITIAL IMPRESSION / MDM / ASSESSMENT AND PLAN / ED COURSE  As part of my medical decision making, I reviewed the following data within the electronic MEDICAL RECORD NUMBER Notes from prior ED visits and  Controlled Substance Database      The patient was evaluated for the symptoms described in the history of present illness. The patient was evaluated in the context of the global COVID-19 pandemic, which necessitated consideration that the patient might be at risk for infection with the SARS-CoV-2 virus that causes COVID-19. Institutional protocols and algorithms that pertain to the evaluation of patients at risk for COVID-19 are in a state of rapid change based on information released by regulatory bodies including the CDC and federal and state organizations. The most current policies and algorithms were followed during the patient's care in the ED.   Patient's vital signs are stable with no significant tachypnea and no significant tachycardia. PERC negative.  Low risk for ACS. No recent high-risk travel or sick contacts that would suggest an increased risk of COVID-19.     On the entry to  the property, patient was sent to the wrong entrance. Patient was to be seen as an outpatient at another facility at this time for outpatient testing, the mistake was identified and patient's visit was not completed.     Note:  This document was prepared using Dragon voice recognition software and may include unintentional dictation errors.    Darletta Moll, PA-C 01/17/19 1634    Eula Listen, MD 01/17/19 [phone]

## 2019-01-17 NOTE — ED Triage Notes (Signed)
Pt in by self; saw PCP and was sent here d/t cough, sinus issues, HA, body aches, flu neg at primary, denies SOB. Steady.

## 2019-01-17 NOTE — ED Notes (Signed)
Debbie Powers, [place] talking with and assessing pt.

## 2019-02-07 DIAGNOSIS — M5442 Lumbago with sciatica, left side: Secondary | ICD-10-CM | POA: Diagnosis not present

## 2019-02-07 DIAGNOSIS — M5136 Other intervertebral disc degeneration, lumbar region: Secondary | ICD-10-CM | POA: Diagnosis not present

## 2019-02-07 DIAGNOSIS — M5416 Radiculopathy, lumbar region: Secondary | ICD-10-CM | POA: Diagnosis not present

## 2019-02-21 DIAGNOSIS — M5136 Other intervertebral disc degeneration, lumbar region: Secondary | ICD-10-CM | POA: Diagnosis not present

## 2019-02-21 DIAGNOSIS — M5126 Other intervertebral disc displacement, lumbar region: Secondary | ICD-10-CM | POA: Diagnosis not present

## 2019-02-21 DIAGNOSIS — M5416 Radiculopathy, lumbar region: Secondary | ICD-10-CM | POA: Diagnosis not present

## 2019-03-05 DIAGNOSIS — M5416 Radiculopathy, lumbar region: Secondary | ICD-10-CM | POA: Diagnosis not present

## 2019-03-05 DIAGNOSIS — M5126 Other intervertebral disc displacement, lumbar region: Secondary | ICD-10-CM | POA: Diagnosis not present

## 2019-05-28 ENCOUNTER — Other Ambulatory Visit: Payer: Self-pay | Admitting: Internal Medicine

## 2019-05-28 DIAGNOSIS — Z1231 Encounter for screening mammogram for malignant neoplasm of breast: Secondary | ICD-10-CM

## 2019-06-04 ENCOUNTER — Other Ambulatory Visit: Payer: Self-pay | Admitting: Physical Medicine and Rehabilitation

## 2019-06-04 DIAGNOSIS — M5416 Radiculopathy, lumbar region: Secondary | ICD-10-CM

## 2019-06-11 ENCOUNTER — Ambulatory Visit
Admission: RE | Admit: 2019-06-11 | Discharge: 2019-06-11 | Disposition: A | Discharge status: Home / Self Care | Payer: 59 | Source: Ambulatory Visit | Attending: Physical Medicine and Rehabilitation | Admitting: Physical Medicine and Rehabilitation

## 2019-06-11 ENCOUNTER — Other Ambulatory Visit: Payer: Self-pay

## 2019-06-11 DIAGNOSIS — M5416 Radiculopathy, lumbar region: Secondary | ICD-10-CM | POA: Insufficient documentation

## 2019-06-26 ENCOUNTER — Encounter (INDEPENDENT_AMBULATORY_CARE_PROVIDER_SITE_OTHER): Payer: Self-pay

## 2019-06-26 ENCOUNTER — Ambulatory Visit
Admission: RE | Admit: 2019-06-26 | Discharge: 2019-06-26 | Disposition: A | Discharge status: Home / Self Care | Payer: 59 | Source: Ambulatory Visit | Attending: Internal Medicine | Admitting: Internal Medicine

## 2019-06-26 ENCOUNTER — Other Ambulatory Visit: Payer: Self-pay

## 2019-06-26 DIAGNOSIS — Z1231 Encounter for screening mammogram for malignant neoplasm of breast: Secondary | ICD-10-CM | POA: Diagnosis present

## 2019-10-09 ENCOUNTER — Encounter: Payer: Self-pay | Admitting: Emergency Medicine

## 2019-10-09 ENCOUNTER — Other Ambulatory Visit: Payer: Self-pay

## 2019-10-09 ENCOUNTER — Ambulatory Visit
Admission: EM | Admit: 2019-10-09 | Discharge: 2019-10-09 | Disposition: A | Discharge status: Home / Self Care | Payer: 59 | Attending: Urgent Care | Admitting: Urgent Care

## 2019-10-09 DIAGNOSIS — R059 Cough, unspecified: Secondary | ICD-10-CM

## 2019-10-09 DIAGNOSIS — R05 Cough: Secondary | ICD-10-CM | POA: Diagnosis not present

## 2019-10-09 DIAGNOSIS — R0981 Nasal congestion: Secondary | ICD-10-CM

## 2019-10-09 DIAGNOSIS — Z7189 Other specified counseling: Secondary | ICD-10-CM

## 2019-10-09 MED ORDER — BENZONATATE 100 MG PO CAPS: 100.0000 mg | ORAL_CAPSULE | Freq: Three times a day (TID) | ORAL | 0 refills | Status: DC | PRN

## 2019-10-09 MED ORDER — HYDROCOD POLST-CPM POLST ER 10-8 MG/5ML PO SUER: 5.0000 mL | Freq: Every evening | ORAL | 0 refills | Status: DC | PRN

## 2019-10-09 NOTE — ED Provider Notes (Signed)
[facility] ____________________________________________  Time seen: Approximately 10:32 AM  I have reviewed the triage vital signs and the nursing notes.   HISTORY  Chief Complaint No chief complaint on file.   HPI Debbie Powers is a 58 y.o. female has medical history of hypertension, diabetes and GERD presenting for evaluation of 3 days of cough and congestion complaints.  Patient reports cough is beginning to disrupt her sleep at night.  Continues with nasal congestion.  Sometimes has pressure in both ears.  Denies current sore throat.  Denies known fevers.  Reports has lost some of her sense of taste but able to still smell.  Some diarrhea, no vomiting.  Denies chest pain or shortness of breath.  Has continued remain active.  Denies of known direct sick contacts, denies known COVID-19 exposures.  Did try some over-the-counter antihistamine without resolution.  Denies other aggravating or alleviating factors.  Ezequiel Kayser, MD : PCP   Past Medical History:  Diagnosis Date  . Asthma   . Bilateral carpal tunnel syndrome   . Degenerative disc disease, cervical   . Degenerative disc disease, lumbar   . Depression   . Diabetes mellitus type 2 with retinopathy ([place])   . GERD (gastroesophageal reflux disease)   . History of herpes genitalis   . Hyperlipidemia   . Hypertension   . Obesity   . Osteoarthritis of both knees   . Seasonal rhinitis   . Wears dentures     There are no problems to display for this patient.   Past Surgical History:  Procedure Laterality Date  . CARPAL TUNNEL RELEASE Left 04/09/2016   Procedure: LEFT OPEN CARPAL TUNNEL RELEASE;  Surgeon: Leanor Kail, MD;  Location: [place];  Service: Orthopedics;  Laterality: Left;  . COLONOSCOPY    . FLEXIBLE SIGMOIDOSCOPY  2001  . NOSE SURGERY  1994  . open carpal tunnel release Right 2015     No current facility-administered medications for this encounter.  Current  Outpatient Medications:  .  albuterol (PROVENTIL) (5 MG/ML) 0.5% nebulizer solution, Take 0.5 mLs (2.5 mg total) by nebulization every 6 (six) hours as needed for wheezing or shortness of breath., Disp: 20 mL, Rfl: 12 .  benazepril (LOTENSIN) 40 MG tablet, Take 40 mg by mouth daily., Disp: , Rfl:  .  citalopram (CELEXA) 40 MG tablet, Take 40 mg by mouth daily., Disp: , Rfl:  .  cyclobenzaprine (FLEXERIL) 10 MG tablet, Take 10 mg by mouth 3 (three) times daily as needed for muscle spasms., Disp: , Rfl:  .  etodolac (LODINE) 400 MG tablet, Take 400 mg by mouth 2 (two) times daily., Disp: , Rfl:  .  gabapentin (NEURONTIN) 300 MG capsule, Take 300-600 mg by mouth at bedtime as needed., Disp: , Rfl:  .  glimepiride (AMARYL) 2 MG tablet, Take 2 mg by mouth daily with breakfast., Disp: , Rfl:  .  ibuprofen (ADVIL,MOTRIN) 200 MG tablet, Take 400-800 mg by mouth every 8 (eight) hours as needed., Disp: , Rfl:  .  metFORMIN (GLUCOPHAGE-XR) 500 MG 24 hr tablet, Take 1,000 mg by mouth 2 (two) times daily with a meal., Disp: , Rfl:  .  omeprazole (PRILOSEC OTC) 20 MG tablet, Take 20 mg by mouth daily., Disp: , Rfl:  .  potassium chloride SA (K-DUR,KLOR-CON) 20 MEQ tablet, Take 20 mEq by mouth 3 (three) times daily., Disp: , Rfl:  .  triamterene-hydrochlorothiazide (MAXZIDE-25) 37.5-25 MG tablet, Take 1 tablet by mouth daily., Disp: , Rfl:  .  zolpidem (AMBIEN) 10 MG tablet, Take 10 mg by mouth at bedtime as needed for sleep., Disp: , Rfl:   Allergies Patient has no known allergies.  Family History  Problem Relation Age of Onset  . Heart attack Mother   . Diabetes Mother   . Hypertension Mother   . Stroke Father   . Hypertension Father   . Diabetes Father   . Breast cancer Neg Hx     Social History Social History   Tobacco Use  . Smoking status: Former Smoker    Quit date: 1997    Years since quitting: 23.9  . Smokeless tobacco: Never Used  Substance Use Topics  . Alcohol use: No  . Drug use:  No    Review of Systems Constitutional: No fever.  ENT: No sore throat.  Positive congestion. Cardiovascular: Denies chest pain. Respiratory: Denies shortness of breath. Gastrointestinal: No abdominal pain.  No nausea, no vomiting.  No diarrhea.   Genitourinary: Negative for dysuria. Musculoskeletal: Negative for back pain. Skin: Negative for rash.   ____________________________________________   PHYSICAL EXAM:  VITAL SIGNS: ED Triage Vitals  Enc Vitals Group     BP 10/09/19 0945 126/85     Pulse Rate 10/09/19 0945 70     Resp 10/09/19 0945 18     Temp 10/09/19 0945 98.7 F (37.1 C)     Temp Source 10/09/19 0945 Oral     SpO2 10/09/19 0945 97 %     Weight 10/09/19 0940 235 lb (106.6 kg)     Height 10/09/19 0940 5\' 5"  (1.651 m)     Head Circumference --      Peak Flow --      Pain Score 10/09/19 0939 8     Pain Loc --      Pain Edu? --      Excl. in [place]? --     Constitutional: Alert and oriented. Well appearing and in no acute distress. Eyes: Conjunctivae are normal. Head: Atraumatic. No sinus tenderness to palpation. No swelling. No erythema.  Ears: no erythema, normal TMs bilaterally.   Nose:Nasal congestion   Mouth/Throat: Mucous membranes are moist. No pharyngeal erythema. No tonsillar swelling or exudate.  Neck: No stridor.  No cervical spine tenderness to palpation. Hematological/Lymphatic/Immunilogical: No cervical lymphadenopathy. Cardiovascular: Normal rate, regular rhythm. Grossly normal heart sounds.  Good peripheral circulation. Respiratory: Normal respiratory effort.  No retractions. No wheezes, rales or rhonchi. Good air movement.  Musculoskeletal: Ambulatory with steady gait.  Neurologic:  Normal speech and language. No gait instability. Skin:  Skin appears warm, dry and intact. No rash noted. Psychiatric: Mood and affect are normal. Speech and behavior are normal.  ___________________________________________   LABS (all labs ordered are listed,  but only abnormal results are displayed)  Labs Reviewed  NOVEL CORONAVIRUS, NAA (HOSP ORDER, SEND-OUT TO REF LAB; TAT 18-24 HRS)    PROCEDURES Procedures    INITIAL IMPRESSION / ASSESSMENT AND PLAN / ED COURSE  Pertinent labs & imaging results that were available during my care of the patient were reviewed by me and considered in my medical decision making (see chart for details).  Well-appearing patient.  No acute distress.  Suspect viral illness.  Possible COVID-19, advice given.  COVID-19 testing completed.  Encourage rest, fluids, over-the-counter Mucinex as needed.Lavella Lemons Perles and Tussionex Rx given.  Supportive care.  Work note given.Discussed indication, risks and benefits of medications with patient.   Discussed follow up with Primary care physician this week as needed.  Discussed follow up and return parameters including no resolution or any worsening concerns. Patient verbalized understanding and agreed to plan.   ____________________________________________   FINAL CLINICAL IMPRESSION(S) / ED DIAGNOSES  Final diagnoses:  Cough  Advice given about COVID-19 virus infection     ED Discharge Orders    None       Note: This dictation was prepared with Dragon dictation along with smaller phrase technology. Any transcriptional errors that result from this process are unintentional.         Marylene Land, NP 10/09/19 1124

## 2019-10-09 NOTE — Discharge Instructions (Signed)
rest. Drink plenty of fluids.   Follow up with your primary care physician this week as needed. Return to Urgent care or ER for new or worsening concerns.

## 2019-10-09 NOTE — ED Triage Notes (Signed)
Patient c/o nasal congestion, chest congestion and cough that started on Friday. Patient is not sure if she has had exposure.

## 2019-10-10 LAB — NOVEL CORONAVIRUS, NAA (HOSP ORDER, SEND-OUT TO REF LAB; TAT 18-24 HRS): SARS-CoV-2, NAA: NOT DETECTED

## 2019-12-14 ENCOUNTER — Other Ambulatory Visit: Payer: Self-pay | Admitting: Orthopedic Surgery

## 2019-12-14 DIAGNOSIS — M5412 Radiculopathy, cervical region: Secondary | ICD-10-CM

## 2019-12-14 DIAGNOSIS — M5030 Other cervical disc degeneration, unspecified cervical region: Secondary | ICD-10-CM

## 2019-12-14 DIAGNOSIS — M542 Cervicalgia: Secondary | ICD-10-CM

## 2020-01-24 ENCOUNTER — Ambulatory Visit
Admission: RE | Admit: 2020-01-24 | Discharge: 2020-01-24 | Disposition: A | Discharge status: Home / Self Care | Payer: 59 | Source: Ambulatory Visit | Attending: Orthopedic Surgery | Admitting: Orthopedic Surgery

## 2020-01-24 ENCOUNTER — Other Ambulatory Visit: Payer: Self-pay

## 2020-01-24 DIAGNOSIS — M5030 Other cervical disc degeneration, unspecified cervical region: Secondary | ICD-10-CM

## 2020-01-24 DIAGNOSIS — M542 Cervicalgia: Secondary | ICD-10-CM

## 2020-01-24 DIAGNOSIS — M5412 Radiculopathy, cervical region: Secondary | ICD-10-CM

## 2020-02-05 DIAGNOSIS — M5412 Radiculopathy, cervical region: Secondary | ICD-10-CM | POA: Insufficient documentation

## 2020-02-05 DIAGNOSIS — M4802 Spinal stenosis, cervical region: Secondary | ICD-10-CM | POA: Insufficient documentation

## 2020-02-05 DIAGNOSIS — M542 Cervicalgia: Secondary | ICD-10-CM | POA: Insufficient documentation

## 2021-05-29 ENCOUNTER — Ambulatory Visit
Admission: EM | Admit: 2021-05-29 | Discharge: 2021-05-29 | Disposition: A | Discharge status: Home / Self Care | Payer: 59 | Attending: Family Medicine | Admitting: Family Medicine

## 2021-05-29 ENCOUNTER — Encounter: Payer: Self-pay | Admitting: Emergency Medicine

## 2021-05-29 ENCOUNTER — Ambulatory Visit (INDEPENDENT_AMBULATORY_CARE_PROVIDER_SITE_OTHER): Payer: 59

## 2021-05-29 ENCOUNTER — Other Ambulatory Visit: Payer: Self-pay

## 2021-05-29 DIAGNOSIS — R1310 Dysphagia, unspecified: Secondary | ICD-10-CM

## 2021-05-29 DIAGNOSIS — J029 Acute pharyngitis, unspecified: Secondary | ICD-10-CM

## 2021-05-29 MED ORDER — FLUCONAZOLE 200 MG PO TABS: 400.0000 mg | ORAL_TABLET | Freq: Every day | ORAL | 0 refills | Status: AC

## 2021-05-29 MED ORDER — PANTOPRAZOLE SODIUM 40 MG PO TBEC: 40.0000 mg | DELAYED_RELEASE_TABLET | Freq: Two times a day (BID) | ORAL | 0 refills | Status: AC

## 2021-05-29 NOTE — ED Provider Notes (Signed)
[facility]    CSN: [id number] Arrival date & time: 05/29/21  1825      History   Chief Complaint Chief Complaint  Patient presents with  . Sore Throat    HPI  60 year old female presents with above complaint.  Patient states that for the past 10 days she has had difficulty and pain with swallowing.  She is able to swallow liquids and soft foods but it is painful.  No fever.  No abdominal pain.  No recent illness.  Patient does take steroid inhaler but states that she swishes her mouth out afterwards regularly.  No reports of recent thrush.  She has had some dental work recently.  She has an upper denture.  No relieving factors.  Rates the pain is 8/10 in severity.  Patient endorses compliance with PPI therapy.  Past Medical History:  Diagnosis Date  . Asthma   . Bilateral carpal tunnel syndrome   . Degenerative disc disease, cervical   . Degenerative disc disease, lumbar   . Depression   . Diabetes mellitus type 2 with retinopathy ([place])   . GERD (gastroesophageal reflux disease)   . History of herpes genitalis   . Hyperlipidemia   . Hypertension   . Obesity   . Osteoarthritis of both knees   . Seasonal rhinitis   . Wears dentures     There are no problems to display for this patient.   Past Surgical History:  Procedure Laterality Date  . CARPAL TUNNEL RELEASE Left 04/09/2016   Procedure: LEFT OPEN CARPAL TUNNEL RELEASE;  Surgeon: Leanor Kail, MD;  Location: [place];  Service: Orthopedics;  Laterality: Left;  . COLONOSCOPY    . FLEXIBLE SIGMOIDOSCOPY  2001  . NOSE SURGERY  1994  . open carpal tunnel release Right 2015    OB History   No obstetric history on file.      Home Medications    Prior to Admission medications   Medication Sig Start Date End Date Taking? Authorizing Provider  aspirin 81 MG EC tablet Take by mouth.   Yes [provider]  atorvastatin (LIPITOR) 40 MG tablet Take 40 mg by mouth daily. 05/12/21   Yes [provider]  benazepril (LOTENSIN) 40 MG tablet Take 40 mg by mouth daily.   Yes [provider]  buPROPion (WELLBUTRIN XL) 150 MG 24 hr tablet Take 150 mg by mouth daily. 05/06/21  Yes [provider]  citalopram (CELEXA) 40 MG tablet Take 40 mg by mouth daily.   Yes [provider]  fluconazole (DIFLUCAN) 200 MG tablet Take 2 tablets (400 mg total) by mouth daily for 7 days. 05/29/21 06/05/21 Yes Cipriana Biller G, DO  fluticasone-salmeterol (ADVAIR) 250-50 MCG/ACT AEPB Inhale into the lungs. 01/26/21 01/26/22 Yes [provider]  gabapentin (NEURONTIN) 300 MG capsule Take 300-600 mg by mouth at bedtime as needed.   Yes [provider]  glimepiride (AMARYL) 2 MG tablet Take 2 mg by mouth daily with breakfast.   Yes [provider]  ibuprofen (ADVIL,MOTRIN) 200 MG tablet Take 400-800 mg by mouth every 8 (eight) hours as needed.   Yes [provider]  LANTUS SOLOSTAR 100 UNIT/ML Solostar Pen SMARTSIG:12 Unit(s) SUB-Q Daily 05/11/21  Yes [provider]  metFORMIN (GLUCOPHAGE-XR) 500 MG 24 hr tablet Take 1,000 mg by mouth 2 (two) times daily with a meal.   Yes [provider]  potassium chloride SA (K-DUR,KLOR-CON) 20 MEQ tablet Take 20 mEq by mouth 3 (three)  times daily.   Yes [provider]  SPIRIVA HANDIHALER 18 MCG inhalation capsule 1 capsule daily. 04/23/21  Yes [provider]  triamterene-hydrochlorothiazide (MAXZIDE-25) 37.5-25 MG tablet Take 1 tablet by mouth daily.   Yes [provider]  zolpidem (AMBIEN) 10 MG tablet Take 10 mg by mouth at bedtime as needed for sleep.   Yes [provider]  albuterol (PROVENTIL) (5 MG/ML) 0.5% nebulizer solution Take 0.5 mLs (2.5 mg total) by nebulization every 6 (six) hours as needed for wheezing or shortness of breath. 04/01/18   [name], DO  cyclobenzaprine (FLEXERIL) 10 MG tablet Take 10 mg by mouth 3 (three) times daily as  needed for muscle spasms.    [provider]  etodolac (LODINE) 400 MG tablet Take 400 mg by mouth 2 (two) times daily.    [provider]  pantoprazole (PROTONIX) 40 MG tablet Take 1 tablet (40 mg total) by mouth 2 (two) times daily. 05/29/21   [name], DO  estradiol (ESTRACE) 0.5 MG tablet Take 0.5 mg by mouth daily.  10/09/19  [provider]  medroxyPROGESTERone (PROVERA) 2.5 MG tablet Take 2.5 mg by mouth daily.  10/09/19  [provider]    Family History Family History  Problem Relation Age of Onset  . Heart attack Mother   . Diabetes Mother   . Hypertension Mother   . Stroke Father   . Hypertension Father   . Diabetes Father   . Breast cancer Neg Hx     Social History Social History   Tobacco Use  . Smoking status: Former    Types: Cigarettes    Quit date: 1997    Years since quitting: 25.6  . Smokeless tobacco: Never  Vaping Use  . Vaping Use: Never used  Substance Use Topics  . Alcohol use: No  . Drug use: No     Allergies   Patient has no known allergies.   Review of Systems Review of Systems Per HPI  Physical Exam Triage Vital Signs ED Triage Vitals  Enc Vitals Group     BP 05/29/21 1843 128/79     Pulse Rate 05/29/21 1843 92     Resp 05/29/21 1843 14     Temp 05/29/21 1843 98.8 F (37.1 C)     Temp Source 05/29/21 1843 Oral     SpO2 05/29/21 1843 96 %     Weight 05/29/21 1837 230 lb (104.3 kg)     Height 05/29/21 1837 '5\' 6"'$  (1.676 m)     Head Circumference --      Peak Flow --      Pain Score 05/29/21 1837 8     Pain Loc --      Pain Edu? --      Excl. in [place]? --    Updated Vital Signs BP 128/79 (BP Location: Right Arm)   Pulse 92   Temp 98.8 F (37.1 C) (Oral)   Resp 14   Ht '5\' 6"'$  (1.676 m)   Wt 104.3 kg   SpO2 96%   BMI 37.12 kg/m   Visual Acuity Right Eye Distance:   Left Eye Distance:   Bilateral Distance:    Right Eye Near:   Left Eye Near:    Bilateral Near:     Physical  Exam Constitutional:      General: She is not in acute distress.    Appearance: Normal appearance. She is not ill-appearing.  HENT:     Head:  Normocephalic and atraumatic.     Mouth/Throat:     Pharynx: Posterior oropharyngeal erythema present. No oropharyngeal exudate.  Cardiovascular:     Rate and Rhythm: Normal rate and regular rhythm.     Heart sounds: No murmur heard. Pulmonary:     Effort: Pulmonary effort is normal.     Breath sounds: Normal breath sounds. No wheezing, rhonchi or rales.  Neurological:     Mental Status: She is alert.  Psychiatric:        Mood and Affect: Mood normal.        Behavior: Behavior normal.     UC Treatments / Results  Labs (all labs ordered are listed, but only abnormal results are displayed) Labs Reviewed - No data to display  EKG   Radiology DG Neck Soft Tissue  Result Date: 05/29/2021 CLINICAL DATA:  Sore throat.  Dysphagia. EXAM: NECK SOFT TISSUES - 1+ VIEW COMPARISON:  None. FINDINGS: Unremarkable soft tissue shadows. No evidence of prevertebral/retropharyngeal soft tissue swelling. Air shadows appear normal. Ordinary cervical anterior osteophyte formation, not seemingly large enough to result in dysphagia. IMPRESSION: Negative radiographs. No abnormality seen to explain the clinical presentation. Electronically Signed   By: Nelson Chimes M.D.   On: 05/29/2021 19:16    Procedures Procedures (including critical care time)  Medications Ordered in UC Medications - No data to display  Initial Impression / Assessment and Plan / UC Course  I have reviewed the triage vital signs and the nursing notes.  Pertinent labs & imaging results that were available during my care of the patient were reviewed by me and considered in my medical decision making (see chart for details).    60 year old female presents with odynophagia.  X-ray was obtained and was independently reviewed by me.  Interpretation: Normal x-ray of the soft tissue of the neck.  There is no clinical evidence of thrush at this time.  I am concerned that she could may have esophagitis, either from GERD or from Candida in the setting of chronic inhaled corticosteroid use.  Placing on Diflucan.  Increasing Protonix to twice daily.  Advised to contact us if she fails to improve as she will need referral to GI for endoscopy.  Final Clinical Impressions(s) / UC Diagnoses   Final diagnoses:  Odynophagia     Discharge Instructions      Medication as prescribed.  If your symptoms persist, please let us know and I will refer you to Gastroenterology.  Take care  Dr. Lacinda Axon      ED Prescriptions     Medication Sig Dispense Auth. Provider   fluconazole (DIFLUCAN) 200 MG tablet Take 2 tablets (400 mg total) by mouth daily for 7 days. 14 tablet Jamez Ambrocio G, DO   pantoprazole (PROTONIX) 40 MG tablet Take 1 tablet (40 mg total) by mouth 2 (two) times daily. 60 tablet Thersa Salt G, DO      PDMP not reviewed this encounter.   [name], [place] 05/29/21 1948

## 2021-05-29 NOTE — ED Triage Notes (Signed)
Patient c/o sore throat that started last week.  Patient reports pain when swallowing.  Patient states that she thinks she may have something stuck in her throat.

## 2021-05-29 NOTE — Discharge Instructions (Addendum)
Medication as prescribed.  If your symptoms persist, please let us know and I will refer you to Gastroenterology.  Take care  Dr. Lacinda Axon

## 2021-06-12 ENCOUNTER — Other Ambulatory Visit: Payer: Self-pay

## 2021-06-12 ENCOUNTER — Ambulatory Visit (LOCAL_COMMUNITY_HEALTH_CENTER): Payer: Self-pay

## 2021-06-12 DIAGNOSIS — Z111 Encounter for screening for respiratory tuberculosis: Secondary | ICD-10-CM

## 2021-06-15 ENCOUNTER — Other Ambulatory Visit: Payer: Self-pay

## 2021-06-15 ENCOUNTER — Ambulatory Visit (LOCAL_COMMUNITY_HEALTH_CENTER): Payer: Self-pay

## 2021-06-15 DIAGNOSIS — Z111 Encounter for screening for respiratory tuberculosis: Secondary | ICD-10-CM

## 2021-06-15 LAB — TB SKIN TEST
Induration: 0 mm
TB Skin Test: NEGATIVE

## 2021-08-11 ENCOUNTER — Other Ambulatory Visit: Payer: Self-pay | Admitting: Family Medicine

## 2021-08-19 ENCOUNTER — Ambulatory Visit
Admission: EM | Admit: 2021-08-19 | Discharge: 2021-08-19 | Disposition: A | Discharge status: Home / Self Care | Payer: 59 | Attending: Internal Medicine | Admitting: Internal Medicine

## 2021-08-19 ENCOUNTER — Other Ambulatory Visit: Payer: Self-pay

## 2021-08-19 ENCOUNTER — Encounter: Payer: Self-pay | Admitting: Emergency Medicine

## 2021-08-19 DIAGNOSIS — Z79890 Hormone replacement therapy (postmenopausal): Secondary | ICD-10-CM | POA: Insufficient documentation

## 2021-08-19 DIAGNOSIS — Z794 Long term (current) use of insulin: Secondary | ICD-10-CM | POA: Diagnosis not present

## 2021-08-19 DIAGNOSIS — R509 Fever, unspecified: Secondary | ICD-10-CM | POA: Insufficient documentation

## 2021-08-19 DIAGNOSIS — Z79899 Other long term (current) drug therapy: Secondary | ICD-10-CM | POA: Insufficient documentation

## 2021-08-19 DIAGNOSIS — Z7951 Long term (current) use of inhaled steroids: Secondary | ICD-10-CM | POA: Insufficient documentation

## 2021-08-19 DIAGNOSIS — Z87891 Personal history of nicotine dependence: Secondary | ICD-10-CM | POA: Diagnosis not present

## 2021-08-19 DIAGNOSIS — Z7982 Long term (current) use of aspirin: Secondary | ICD-10-CM | POA: Diagnosis not present

## 2021-08-19 DIAGNOSIS — J069 Acute upper respiratory infection, unspecified: Secondary | ICD-10-CM | POA: Insufficient documentation

## 2021-08-19 DIAGNOSIS — Z7984 Long term (current) use of oral hypoglycemic drugs: Secondary | ICD-10-CM | POA: Diagnosis not present

## 2021-08-19 DIAGNOSIS — Z20822 Contact with and (suspected) exposure to covid-19: Secondary | ICD-10-CM | POA: Insufficient documentation

## 2021-08-19 DIAGNOSIS — J209 Acute bronchitis, unspecified: Secondary | ICD-10-CM | POA: Diagnosis not present

## 2021-08-19 DIAGNOSIS — R051 Acute cough: Secondary | ICD-10-CM | POA: Diagnosis present

## 2021-08-19 MED ORDER — DOXYCYCLINE HYCLATE 100 MG PO CAPS: 100.0000 mg | ORAL_CAPSULE | Freq: Two times a day (BID) | ORAL | 0 refills | Status: DC

## 2021-08-19 MED ORDER — BENZONATATE 200 MG PO CAPS: 200.0000 mg | ORAL_CAPSULE | Freq: Two times a day (BID) | ORAL | 0 refills | Status: DC | PRN

## 2021-08-19 MED ORDER — BACLOFEN 10 MG PO TABS: 10.0000 mg | ORAL_TABLET | Freq: Three times a day (TID) | ORAL | 0 refills | Status: DC

## 2021-08-19 NOTE — ED Triage Notes (Signed)
Pt presents today with c/o of cough, fever and body aches x 1 week. No home Covid tests.

## 2021-08-19 NOTE — ED Provider Notes (Signed)
[facility]    CSN: [id number] Arrival date & time: 08/19/21  1436      History   Chief Complaint Chief Complaint  Patient presents with   Cough   Fever    HPI Debbie Powers is a 60 y.o. female who presents with onset of cough, low grade fever and body aches x 1 week. The body aches are mild, but the neck stiffness and pain x 2 days. Her cough has been productive with gray and yellow mucous. Has been using her neb machine and helped her cough attacks some. Has also taken some mucinex. Her employer asked her to come and be seen.     Past Medical History:  Diagnosis Date   Asthma    Bilateral carpal tunnel syndrome    Degenerative disc disease, cervical    Degenerative disc disease, lumbar    Depression    Diabetes mellitus type 2 with retinopathy (HCC)    GERD (gastroesophageal reflux disease)    History of herpes genitalis    Hyperlipidemia    Hypertension    Obesity    Osteoarthritis of both knees    Seasonal rhinitis    Wears dentures     There are no problems to display for this patient.   Past Surgical History:  Procedure Laterality Date   CARPAL TUNNEL RELEASE Left 04/09/2016   Procedure: LEFT OPEN CARPAL TUNNEL RELEASE;  Surgeon: Leanor Kail, MD;  Location: [place];  Service: Orthopedics;  Laterality: Left;   COLONOSCOPY     FLEXIBLE SIGMOIDOSCOPY  2001   NOSE SURGERY  1994   open carpal tunnel release Right 2015    OB History   No obstetric history on file.      Home Medications    Prior to Admission medications   Medication Sig Start Date End Date Taking? Authorizing Provider  baclofen (LIORESAL) 10 MG tablet Take 1 tablet (10 mg total) by mouth 3 (three) times daily. 08/19/21  Yes Rodriguez-Southworth, Sunday Spillers, PA-C  benzonatate (TESSALON) 200 MG capsule Take 1 capsule (200 mg total) by mouth 2 (two) times daily as needed for cough. 08/19/21  Yes Rodriguez-Southworth, Sunday Spillers, PA-C  doxycycline (VIBRAMYCIN) 100 MG  capsule Take 1 capsule (100 mg total) by mouth 2 (two) times daily. 08/19/21  Yes Rodriguez-Southworth, Sunday Spillers, PA-C  albuterol (PROVENTIL) (5 MG/ML) 0.5% nebulizer solution Take 0.5 mLs (2.5 mg total) by nebulization every 6 (six) hours as needed for wheezing or shortness of breath. 04/01/18   [name], DO  aspirin 81 MG EC tablet Take by mouth.    [provider]  atorvastatin (LIPITOR) 40 MG tablet Take 40 mg by mouth daily. 05/12/21   [provider]  benazepril (LOTENSIN) 40 MG tablet Take 40 mg by mouth daily.    [provider]  buPROPion (WELLBUTRIN XL) 150 MG 24 hr tablet Take 150 mg by mouth daily. 05/06/21   [provider]  citalopram (CELEXA) 40 MG tablet Take 40 mg by mouth daily.    [provider]  cyclobenzaprine (FLEXERIL) 10 MG tablet Take 10 mg by mouth 3 (three) times daily as needed for muscle spasms.    [provider]  fluticasone-salmeterol (ADVAIR) 250-50 MCG/ACT AEPB Inhale into the lungs. 01/26/21 01/26/22  [provider]  gabapentin (NEURONTIN) 300 MG capsule Take 300-600 mg by mouth at bedtime as needed.    [provider]  glimepiride (AMARYL) 2 MG tablet Take 2 mg by mouth daily with breakfast.  [provider]  ibuprofen (ADVIL,MOTRIN) 200 MG tablet Take 400-800 mg by mouth every 8 (eight) hours as needed.    [provider]  LANTUS SOLOSTAR 100 UNIT/ML Solostar Pen SMARTSIG:12 Unit(s) SUB-Q Daily 05/11/21   [provider]  metFORMIN (GLUCOPHAGE-XR) 500 MG 24 hr tablet Take 1,000 mg by mouth 2 (two) times daily with a meal.    [provider]  pantoprazole (PROTONIX) 40 MG tablet Take 1 tablet (40 mg total) by mouth 2 (two) times daily. 05/29/21   [name], DO  potassium chloride SA (K-DUR,KLOR-CON) 20 MEQ tablet Take 20 mEq by mouth 3 (three) times daily.    [provider]  SPIRIVA HANDIHALER 18 MCG inhalation capsule 1 capsule daily. 04/23/21    [provider]  triamterene-hydrochlorothiazide (MAXZIDE-25) 37.5-25 MG tablet Take 1 tablet by mouth daily.    [provider]  zolpidem (AMBIEN) 10 MG tablet Take 10 mg by mouth at bedtime as needed for sleep.    [provider]  estradiol (ESTRACE) 0.5 MG tablet Take 0.5 mg by mouth daily.  10/09/19  [provider]  medroxyPROGESTERone (PROVERA) 2.5 MG tablet Take 2.5 mg by mouth daily.  10/09/19  [provider]    Family History Family History  Problem Relation Age of Onset   Heart attack Mother    Diabetes Mother    Hypertension Mother    Stroke Father    Hypertension Father    Diabetes Father    Breast cancer Neg Hx     Social History Social History   Tobacco Use   Smoking status: Former    Types: Cigarettes    Quit date: 1997    Years since quitting: 25.8   Smokeless tobacco: Never  Vaping Use   Vaping Use: Never used  Substance Use Topics   Alcohol use: No   Drug use: No     Allergies   Patient has no known allergies.   Review of Systems Review of Systems  Constitutional:  Positive for chills, fatigue and fever. Negative for activity change, appetite change and diaphoresis.  HENT:  Positive for congestion, postnasal drip and rhinorrhea. Negative for ear discharge, ear pain, sore throat and trouble swallowing.   Eyes:  Negative for discharge.  Respiratory:  Positive for cough. Negative for chest tightness and wheezing.   Cardiovascular:  Negative for chest pain.  Musculoskeletal:  Negative for gait problem.  Skin:  Negative for rash.  Hematological:  Negative for adenopathy.    Physical Exam Triage Vital Signs ED Triage Vitals  Enc Vitals Group     BP 08/19/21 1540 134/68     Pulse Rate 08/19/21 1540 73     Resp 08/19/21 1540 20     Temp 08/19/21 1540 98.2 F (36.8 C)     Temp Source 08/19/21 1540 Oral     SpO2 08/19/21 1540 100 %     Weight --      Height --      Head Circumference --      Peak  Flow --      Pain Score 08/19/21 1538 8     Pain Loc --      Pain Edu? --      Excl. in [place]? --    No data found.  Updated Vital Signs BP 134/68 (BP Location: Right Arm)   Pulse 73   Temp 98.2 F (36.8 C) (Oral)   Resp 20   SpO2 100%   Visual  Acuity Right Eye Distance:   Left Eye Distance:   Bilateral Distance:    Right Eye Near:   Left Eye Near:    Bilateral Near:     Physical Exam Physical Exam Vitals signs and nursing note reviewed.  Constitutional:      General: She is not in acute distress.    Appearance: Normal appearance. She is not ill-appearing, toxic-appearing or diaphoretic.  HENT:     Head: Normocephalic.     Right Ear: Tympanic membrane, ear canal and external ear normal.     Left Ear: Tympanic membrane, ear canal and external ear normal.     Nose: Nose normal.     Mouth/Throat:     Mouth: Mucous membranes are moist.  Eyes:     General: No scleral icterus.       Right eye: No discharge.        Left eye: No discharge.     Conjunctiva/sclera: Conjunctivae normal.  Neck:     Musculoskeletal: Neck supple. No neck rigidity. Has tender L sternocleidomastoid muscle. Has decreased rotation to her R due to pain  Cardiovascular:     Rate and Rhythm: Normal rate and regular rhythm.     Heart sounds: No murmur.  Pulmonary: Has a wheezy sounding cough    Effort: Pulmonary effort is normal.     Breath sounds: Normal breath sounds.   Musculoskeletal: Normal range of motion.  Lymphadenopathy:     Cervical: No cervical adenopathy.  Skin:    General: Skin is warm and dry.     Coloration: Skin is not jaundiced.     Findings: No rash.  Neurological:     Mental Status: She is alert and oriented to person, place, and time.     Gait: Gait normal.  Psychiatric:        Mood and Affect: Mood normal.        Behavior: Behavior normal.        Thought Content: Thought content normal.        Judgment: Judgment normal.    UC Treatments / Results  Labs (all labs  ordered are listed, but only abnormal results are displayed) Labs Reviewed  SARS CORONAVIRUS 2 (TAT 6-24 HRS)    EKG   Radiology No results found.  Procedures Procedures (including critical care time)  Medications Ordered in UC Medications - No data to display  Initial Impression / Assessment and Plan / UC Course  I have reviewed the triage vital signs and the nursing notes. URI with possibly early bronchitis and L neck muscle pain.  I placed her on Doxy, Tessalon and Baclofen as noted  Covid test is pending. See instructions      Final Clinical Impressions(s) / UC Diagnoses   Final diagnoses:  Upper respiratory tract infection, unspecified type  Acute bronchitis, unspecified organism   Discharge Instructions   None    ED Prescriptions     Medication Sig Dispense Auth. Provider   doxycycline (VIBRAMYCIN) 100 MG capsule Take 1 capsule (100 mg total) by mouth 2 (two) times daily. 20 capsule Rodriguez-Southworth, Sunday Spillers, PA-C   baclofen (LIORESAL) 10 MG tablet Take 1 tablet (10 mg total) by mouth 3 (three) times daily. 20 each Rodriguez-Southworth, Sunday Spillers, PA-C   benzonatate (TESSALON) 200 MG capsule Take 1 capsule (200 mg total) by mouth 2 (two) times daily as needed for cough. 30 capsule Rodriguez-Southworth, Sunday Spillers, [place]      I have reviewed the PDMP during this encounter.   Rodriguez-Southworth,  Sunday Spillers, PA-C 08/19/21 1904

## 2021-08-20 LAB — SARS CORONAVIRUS 2 (TAT 6-24 HRS): SARS Coronavirus 2: NEGATIVE

## 2021-08-26 ENCOUNTER — Other Ambulatory Visit: Payer: Self-pay | Admitting: Nurse Practitioner

## 2021-08-26 DIAGNOSIS — Z1231 Encounter for screening mammogram for malignant neoplasm of breast: Secondary | ICD-10-CM

## 2021-08-31 ENCOUNTER — Other Ambulatory Visit: Payer: Self-pay | Admitting: Family Medicine

## 2021-10-04 ENCOUNTER — Ambulatory Visit
Admission: EM | Admit: 2021-10-04 | Discharge: 2021-10-04 | Disposition: A | Discharge status: Home / Self Care | Payer: 59 | Attending: Physician Assistant | Admitting: Physician Assistant

## 2021-10-04 ENCOUNTER — Other Ambulatory Visit: Payer: Self-pay

## 2021-10-04 DIAGNOSIS — I10 Essential (primary) hypertension: Secondary | ICD-10-CM

## 2021-10-04 DIAGNOSIS — J441 Chronic obstructive pulmonary disease with (acute) exacerbation: Secondary | ICD-10-CM | POA: Diagnosis not present

## 2021-10-04 MED ORDER — BENZONATATE 100 MG PO CAPS: 100.0000 mg | ORAL_CAPSULE | Freq: Three times a day (TID) | ORAL | 0 refills | Status: DC

## 2021-10-04 MED ORDER — PREDNISONE 10 MG PO TABS: 20.0000 mg | ORAL_TABLET | Freq: Every day | ORAL | 0 refills | Status: AC

## 2021-10-04 MED ORDER — AMOXICILLIN-POT CLAVULANATE 875-125 MG PO TABS: 1.0000 | ORAL_TABLET | Freq: Two times a day (BID) | ORAL | 0 refills | Status: DC

## 2021-10-04 NOTE — Discharge Instructions (Signed)
We are going to start you on Augmentin twice daily.  I would like you to start a low-dose prednisone (20 mg for 3 days).  Do not take NSAIDs including aspirin, ibuprofen/Advil, naproxen/Aleve with this medication.  This can raise your blood sugar so monitor blood sugar closely and drink plenty of fluid and avoid carbohydrates.  If your blood sugars persistently above 200 please contact us or your PCP so we can adjust your insulin.  Continue all medications for COPD as previously prescribed.  Use Tessalon for cough.  Use Mucinex and Flonase for additional symptom relief.  If you develop chest pain, shortness of breath, worsening cough, fever, fatigue, nausea/vomiting interfering with oral intake you need to go to the emergency room.  Follow-up with your pulmonologist as we discussed.  Your blood pressure is elevated.  Please monitor this at home.  Take all your medication as prescribed.  Avoid caffeine, salt, decongestants.  If you develop any chest pain, shortness of breath, headache, dizziness, vision changes you need to go to the emergency room.  Follow-up with your PCP within a week.

## 2021-10-04 NOTE — ED Provider Notes (Signed)
[facility]    CSN: [id number] Arrival date & time: 10/04/21  0957      History   Chief Complaint Chief Complaint  Patient presents with   Cough   Sore Throat    HPI Debbie Powers is a 60 y.o. female.   Patient presenting with an 8-day history of worsening cough.  She reports cough, sore throat, chest tightness, fatigue, malaise.  Denies any chest pain, increased shortness of breath, nausea, vomiting.  She has been using over-the-counter medications including Mucinex without improvement of symptoms.  She was treated approximately 6 weeks ago for similar symptoms with doxycycline reports improving until recurrence 1 week ago.  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  She has had COVID-19 and influenza vaccine.  She has a history of asthma and COPD and is currently managed with Spiriva and Advair.  Reports taking these medications as prescribed.  She has been using albuterol nebulizer multiple times per day with minimal improvement of symptoms.  She does have a history of diabetes but reports this is well controlled with her last A1c 6.9% on 08/26/2021.  She is a former smoker but quit several years ago.  She is having difficulty with daily duties as result of symptoms.  Patient has a history of hypertension.  Blood pressure is elevated today.  Denies any current chest pain, dizziness, shortness of breath, vision changes.  She can monitor her blood pressure at home.     Past Medical History:  Diagnosis Date   Asthma    Bilateral carpal tunnel syndrome    Degenerative disc disease, cervical    Degenerative disc disease, lumbar    Depression    Diabetes mellitus type 2 with retinopathy (HCC)    GERD (gastroesophageal reflux disease)    History of herpes genitalis    Hyperlipidemia    Hypertension    Obesity    Osteoarthritis of both knees    Seasonal rhinitis    Wears dentures     There are no problems to display for this  patient.   Past Surgical History:  Procedure Laterality Date   CARPAL TUNNEL RELEASE Left 04/09/2016   Procedure: LEFT OPEN CARPAL TUNNEL RELEASE;  Surgeon: Leanor Kail, MD;  Location: [place];  Service: Orthopedics;  Laterality: Left;   COLONOSCOPY     FLEXIBLE SIGMOIDOSCOPY  2001   NOSE SURGERY  1994   open carpal tunnel release Right 2015    OB History   No obstetric history on file.      Home Medications    Prior to Admission medications   Medication Sig Start Date End Date Taking? Authorizing Provider  albuterol (PROVENTIL) (5 MG/ML) 0.5% nebulizer solution Take 0.5 mLs (2.5 mg total) by nebulization every 6 (six) hours as needed for wheezing or shortness of breath. Patient taking differently: Take 2.5 mg by nebulization every 6 (six) hours as needed for wheezing or shortness of breath. Last used, HS, [id number] 04/01/18  Yes Cook, Jayce G, DO  amoxicillin-clavulanate (AUGMENTIN) 875-125 MG tablet Take 1 tablet by mouth every 12 (twelve) hours. 10/04/21  Yes Elizardo Chilson, Derry Skill, PA-C  aspirin 81 MG EC tablet Take by mouth.   Yes [provider]  atorvastatin (LIPITOR) 40 MG tablet Take 40 mg by mouth daily. 05/12/21  Yes [provider]  benzonatate (TESSALON) 100 MG capsule Take 1 capsule (100 mg total) by mouth every 8 (eight) hours. 10/04/21  Yes Kathryn Linarez, Derry Skill, PA-C  buPROPion (WELLBUTRIN XL) 150 MG 24 hr tablet Take 150 mg by mouth daily. 05/06/21  Yes [provider]  citalopram (CELEXA) 40 MG tablet Take 40 mg by mouth daily.   Yes [provider]  fluticasone-salmeterol (ADVAIR) 250-50 MCG/ACT AEPB Inhale into the lungs. 01/26/21 01/26/22 Yes [provider]  gabapentin (NEURONTIN) 300 MG capsule Take 300-600 mg by mouth at bedtime as needed.   Yes [provider]  glimepiride (AMARYL) 2 MG tablet Take 2 mg by mouth daily with breakfast.   Yes [provider]  ibuprofen (ADVIL,MOTRIN) 200 MG tablet Take  400-800 mg by mouth every 8 (eight) hours as needed.   Yes [provider]  LANTUS SOLOSTAR 100 UNIT/ML Solostar Pen SMARTSIG:12 Unit(s) SUB-Q Daily 05/11/21  Yes [provider]  metFORMIN (GLUCOPHAGE-XR) 500 MG 24 hr tablet Take 1,000 mg by mouth 2 (two) times daily with a meal.   Yes [provider]  pantoprazole (PROTONIX) 40 MG tablet Take 1 tablet (40 mg total) by mouth 2 (two) times daily. 05/29/21  Yes Cook, Jayce G, DO  potassium chloride SA (K-DUR,KLOR-CON) 20 MEQ tablet Take 20 mEq by mouth 3 (three) times daily.   Yes [provider]  predniSONE (DELTASONE) 10 MG tablet Take 2 tablets (20 mg total) by mouth daily for 3 days. 10/04/21 10/07/21 Yes Kodi Guerrera K, PA-C  SPIRIVA HANDIHALER 18 MCG inhalation capsule 1 capsule daily. 04/23/21  Yes [provider]  triamterene-hydrochlorothiazide (MAXZIDE-25) 37.5-25 MG tablet Take 1 tablet by mouth daily.   Yes [provider]  zolpidem (AMBIEN) 10 MG tablet Take 10 mg by mouth at bedtime as needed for sleep.   Yes [provider]  baclofen (LIORESAL) 10 MG tablet Take 1 tablet (10 mg total) by mouth 3 (three) times daily. 08/19/21   Rodriguez-Southworth, Sunday Spillers, PA-C  benazepril (LOTENSIN) 40 MG tablet Take 40 mg by mouth daily.    [provider]  cyclobenzaprine (FLEXERIL) 10 MG tablet Take 10 mg by mouth 3 (three) times daily as needed for muscle spasms.    [provider]  estradiol (ESTRACE) 0.5 MG tablet Take 0.5 mg by mouth daily.  10/09/19  [provider]  medroxyPROGESTERone (PROVERA) 2.5 MG tablet Take 2.5 mg by mouth daily.  10/09/19  [provider]    Family History Family History  Problem Relation Age of Onset   Heart attack Mother    Diabetes Mother    Hypertension Mother    Stroke Father    Hypertension Father    Diabetes Father    Breast cancer Neg Hx     Social History Social History   Tobacco Use   Smoking status:  Former    Types: Cigarettes    Quit date: 1997    Years since quitting: 25.9   Smokeless tobacco: Never  Vaping Use   Vaping Use: Never used  Substance Use Topics   Alcohol use: No   Drug use: No     Allergies   Patient has no known allergies.   Review of Systems Review of Systems  Constitutional:  Positive for activity change, appetite change and fatigue. Negative for fever.  HENT:  Positive for congestion, sinus pressure and sore throat. Negative for sneezing.   Respiratory:  Positive for cough. Negative for shortness of breath.   Cardiovascular:  Negative for chest pain.  Gastrointestinal:  Negative for abdominal pain, diarrhea, nausea and vomiting.  Neurological:  Negative for dizziness, light-headedness and headaches.  Physical Exam Triage Vital Signs ED Triage Vitals  Enc Vitals Group     BP 10/04/21 1119 (!) 172/90     Pulse Rate 10/04/21 1119 96     Resp 10/04/21 1119 18     Temp 10/04/21 1119 98.4 F (36.9 C)     Temp Source 10/04/21 1119 Oral     SpO2 10/04/21 1119 99 %     Weight 10/04/21 1035 225 lb (102.1 kg)     Height --      Head Circumference --      Peak Flow --      Pain Score 10/04/21 1035 8     Pain Loc --      Pain Edu? --      Excl. in [place]? --    No data found.  Updated Vital Signs BP (!) 172/90 (BP Location: Left Arm)    Pulse 96    Temp 98.4 F (36.9 C) (Oral)    Resp 18    Wt 225 lb (102.1 kg)    SpO2 99%    BMI 36.32 kg/m   Visual Acuity Right Eye Distance:   Left Eye Distance:   Bilateral Distance:    Right Eye Near:   Left Eye Near:    Bilateral Near:     Physical Exam Vitals reviewed.  Constitutional:      General: She is awake. She is not in acute distress.    Appearance: Normal appearance. She is well-developed. She is not ill-appearing.     Comments: Very pleasant female appears stated age in no acute distress sitting comfortably in exam room  HENT:     Head: Normocephalic and atraumatic.     Right Ear: Tympanic  membrane, ear canal and external ear normal. Tympanic membrane is not erythematous or bulging.     Left Ear: Tympanic membrane, ear canal and external ear normal. Tympanic membrane is not erythematous or bulging.     Nose:     Right Sinus: Maxillary sinus tenderness present. No frontal sinus tenderness.     Left Sinus: Maxillary sinus tenderness present. No frontal sinus tenderness.     Mouth/Throat:     Pharynx: Uvula midline. Posterior oropharyngeal erythema present. No oropharyngeal exudate.     Comments: Erythema and drainage in posterior pharynx Cardiovascular:     Rate and Rhythm: Normal rate and regular rhythm.     Heart sounds: Normal heart sounds, S1 normal and S2 normal. No murmur heard. Pulmonary:     Effort: Pulmonary effort is normal.     Breath sounds: Rhonchi present. No wheezing or rales.     Comments: Scattered rhonchi clear with cough. Psychiatric:        Behavior: Behavior is cooperative.     UC Treatments / Results  Labs (all labs ordered are listed, but only abnormal results are displayed) Labs Reviewed - No data to display  EKG   Radiology No results found.  Procedures Procedures (including critical care time)  Medications Ordered in UC Medications - No data to display  Initial Impression / Assessment and Plan / UC Course  I have reviewed the triage vital signs and the nursing notes.  Pertinent labs & imaging results that were available during my care of the patient were reviewed by me and considered in my medical decision making (see chart for details).     No indication for viral testing given patient has been symptomatic for more than a week and this would not change management.  Concern for COPD exacerbation given increased sputum and worsening cough.  Patient was recently treated with doxycycline so we will transition to Augmentin since she also has sinus tenderness.  X-ray was deferred given oxygen saturation 99% and no signs of consolidation on  exam; discussed that if symptoms are not improving within a few days she needs to be reevaluated which point we would need to consider x-ray.  She was directed to continue COPD medications as previously prescribed.  She was given Tessalon for cough.  Recommended she use Mucinex and Flonase for additional symptom relief.  We will start very low-dose prednisone since her diabetes is well controlled.  Discussed that this can raise her blood sugar and if this is persistently above 200 she needs to be seen or contact her PCP to consider insulin adjustment.  She was instructed to drink plenty of fluid and avoid carbohydrates.  Discussed alarm symptoms that warrant emergent evaluation including shortness of breath, high fever, nausea/vomiting impairing with oral intake, worsening cough.  Strict return precautions given to which she expressed understanding.  Recommended follow-up with pulmonology.  Blood pressure is elevated today.  She denies any signs/symptoms of endorgan damage.  She was encouraged to avoid salt, caffeine, NSAIDs, decongestants.  She is to monitor blood pressure at home and if this is persistently elevated she need to be reevaluated.  She is to take all medication as prescribed.  Discussed that if she develops any chest pain, shortness of breath, headache, dizziness in the setting of high blood pressure she needs to go to the emergency room.  Final Clinical Impressions(s) / UC Diagnoses   Final diagnoses:  COPD exacerbation (HCC)  Elevated blood pressure reading in office with diagnosis of hypertension     Discharge Instructions      We are going to start you on Augmentin twice daily.  I would like you to start a low-dose prednisone (20 mg for 3 days).  Do not take NSAIDs including aspirin, ibuprofen/Advil, naproxen/Aleve with this medication.  This can raise your blood sugar so monitor blood sugar closely and drink plenty of fluid and avoid carbohydrates.  If your blood sugars  persistently above 200 please contact us or your PCP so we can adjust your insulin.  Continue all medications for COPD as previously prescribed.  Use Tessalon for cough.  Use Mucinex and Flonase for additional symptom relief.  If you develop chest pain, shortness of breath, worsening cough, fever, fatigue, nausea/vomiting interfering with oral intake you need to go to the emergency room.  Follow-up with your pulmonologist as we discussed.  Your blood pressure is elevated.  Please monitor this at home.  Take all your medication as prescribed.  Avoid caffeine, salt, decongestants.  If you develop any chest pain, shortness of breath, headache, dizziness, vision changes you need to go to the emergency room.  Follow-up with your PCP within a week.     ED Prescriptions     Medication Sig Dispense Auth. Provider   predniSONE (DELTASONE) 10 MG tablet Take 2 tablets (20 mg total) by mouth daily for 3 days. 6 tablet Ayaan Ringle K, PA-C   amoxicillin-clavulanate (AUGMENTIN) 875-125 MG tablet Take 1 tablet by mouth every 12 (twelve) hours. 14 tablet Chistian Kasler K, PA-C   benzonatate (TESSALON) 100 MG capsule Take 1 capsule (100 mg total) by mouth every 8 (eight) hours. 21 capsule Charlesetta Milliron K, PA-C      PDMP not reviewed this encounter.   Terrilee Croak, PA-C 10/04/21  1143 ° °

## 2021-10-04 NOTE — ED Triage Notes (Signed)
Patient is here today for "Cough and S/t". Started last weekend with "Cough". Then s/t. Body aches now. No fever. Sob "now". No respiratory distress. Uses Nebulizer PRN. No new/unexplained rash. COVID19 vaccines done. Flu vaccine done.

## 2021-10-31 DIAGNOSIS — G4733 Obstructive sleep apnea (adult) (pediatric): Secondary | ICD-10-CM | POA: Diagnosis not present

## 2021-11-02 DIAGNOSIS — G4733 Obstructive sleep apnea (adult) (pediatric): Secondary | ICD-10-CM | POA: Diagnosis not present

## 2021-11-02 DIAGNOSIS — J310 Chronic rhinitis: Secondary | ICD-10-CM | POA: Diagnosis not present

## 2021-11-02 DIAGNOSIS — J449 Chronic obstructive pulmonary disease, unspecified: Secondary | ICD-10-CM | POA: Diagnosis not present

## 2021-11-02 DIAGNOSIS — R053 Chronic cough: Secondary | ICD-10-CM | POA: Diagnosis not present

## 2021-11-03 ENCOUNTER — Other Ambulatory Visit: Payer: Self-pay

## 2021-11-03 ENCOUNTER — Ambulatory Visit
Admission: RE | Admit: 2021-11-03 | Discharge: 2021-11-03 | Disposition: A | Discharge status: Home / Self Care | Payer: 59 | Source: Ambulatory Visit | Attending: Nurse Practitioner | Admitting: Nurse Practitioner

## 2021-11-03 DIAGNOSIS — Z1231 Encounter for screening mammogram for malignant neoplasm of breast: Secondary | ICD-10-CM | POA: Insufficient documentation

## 2021-11-27 DIAGNOSIS — J449 Chronic obstructive pulmonary disease, unspecified: Secondary | ICD-10-CM | POA: Diagnosis not present

## 2021-11-27 DIAGNOSIS — E1159 Type 2 diabetes mellitus with other circulatory complications: Secondary | ICD-10-CM | POA: Diagnosis not present

## 2021-11-27 DIAGNOSIS — E1169 Type 2 diabetes mellitus with other specified complication: Secondary | ICD-10-CM | POA: Diagnosis not present

## 2021-11-27 DIAGNOSIS — E11319 Type 2 diabetes mellitus with unspecified diabetic retinopathy without macular edema: Secondary | ICD-10-CM | POA: Diagnosis not present

## 2021-12-01 DIAGNOSIS — G4733 Obstructive sleep apnea (adult) (pediatric): Secondary | ICD-10-CM | POA: Diagnosis not present

## 2021-12-29 DIAGNOSIS — G4733 Obstructive sleep apnea (adult) (pediatric): Secondary | ICD-10-CM | POA: Diagnosis not present

## 2022-01-01 DIAGNOSIS — G4733 Obstructive sleep apnea (adult) (pediatric): Secondary | ICD-10-CM | POA: Diagnosis not present

## 2022-01-27 ENCOUNTER — Ambulatory Visit (INDEPENDENT_AMBULATORY_CARE_PROVIDER_SITE_OTHER): Payer: 59

## 2022-01-27 ENCOUNTER — Other Ambulatory Visit: Payer: Self-pay

## 2022-01-27 ENCOUNTER — Ambulatory Visit
Admission: EM | Admit: 2022-01-27 | Discharge: 2022-01-27 | Disposition: A | Discharge status: Home / Self Care | Payer: 59

## 2022-01-27 DIAGNOSIS — R0602 Shortness of breath: Secondary | ICD-10-CM

## 2022-01-27 DIAGNOSIS — R059 Cough, unspecified: Secondary | ICD-10-CM

## 2022-01-27 DIAGNOSIS — J441 Chronic obstructive pulmonary disease with (acute) exacerbation: Secondary | ICD-10-CM

## 2022-01-27 MED ORDER — PREDNISONE 50 MG PO TABS: ORAL_TABLET | ORAL | 0 refills | Status: DC

## 2022-01-27 MED ORDER — DOXYCYCLINE HYCLATE 100 MG PO CAPS: 100.0000 mg | ORAL_CAPSULE | Freq: Two times a day (BID) | ORAL | 0 refills | Status: DC

## 2022-01-27 NOTE — ED Triage Notes (Signed)
Patient is here for "horrible cough". Causing "Sob at night". Tessalon pearls "not working". No runny nose. Symptoms started "this past Saturday". No fever.  ?

## 2022-01-27 NOTE — ED Provider Notes (Signed)
?[facility] ? ? ? ?CSN: [id number] ?Arrival date & time: 01/27/22  1023 ? ? ?  ? ?History   ?Chief Complaint ?Chief Complaint  ?Patient presents with  ? Cough  ? Generalized Body Aches  ? ? ?HPI ?Debbie Powers is a 61 y.o. female.  ? ?The history is provided by the patient. No language interpreter was used.  ?Cough ?Cough characteristics:  Productive ?Severity:  Severe ?Onset quality:  Gradual ?Duration:  1 week ?Timing:  Constant ?Progression:  Worsening ?Chronicity:  New ?Context: sick contacts   ?Context: not animal exposure   ?Relieved by:  Nothing ? ?Past Medical History:  ?Diagnosis Date  ? Asthma   ? Bilateral carpal tunnel syndrome   ? Degenerative disc disease, cervical   ? Degenerative disc disease, lumbar   ? Depression   ? Diabetes mellitus type 2 with retinopathy ([place])   ? GERD (gastroesophageal reflux disease)   ? History of herpes genitalis   ? Hyperlipidemia   ? Hypertension   ? Obesity   ? Osteoarthritis of both knees   ? Seasonal rhinitis   ? Wears dentures   ? ? ?Patient Active Problem List  ? Diagnosis Date Noted  ? Cervical radiculitis 02/05/2020  ? Foraminal stenosis of cervical region 02/05/2020  ? Neck pain 02/05/2020  ? COPD, mild ([place]) 02/26/2018  ? Severe obesity (BMI 35.0-39.9) with comorbidity ([place]) 06/27/2017  ? Right Achilles tendinitis 06/16/2017  ? Greater trochanteric bursitis of both hips 03/01/2017  ? Rotator cuff syndrome, right 03/01/2017  ? Postmenopausal 08/12/2016  ? Bilateral carpal tunnel syndrome 12/04/2014  ? Lumbar degenerative disc disease 08/23/2014  ? Insomnia 08/06/2014  ? Osteoarthritis of both knees 08/06/2014  ? Diabetes mellitus type 2 with retinopathy ([place]) 08/05/2014  ? Gastroesophageal reflux disease 08/05/2014  ? High risk medication use 08/05/2014  ? Hyperlipidemia associated with type 2 diabetes mellitus ([place]) 08/05/2014  ? Hypertension associated with type 2 diabetes mellitus ([place]) 08/05/2014  ? Recurrent major depressive disorder, in  partial remission ([place]) 08/05/2014  ? Seasonal rhinitis 08/05/2014  ? ? ?Past Surgical History:  ?Procedure Laterality Date  ? CARPAL TUNNEL RELEASE Left 04/09/2016  ? Procedure: LEFT OPEN CARPAL TUNNEL RELEASE;  Surgeon: Leanor Kail, MD;  Location: [place];  Service: Orthopedics;  Laterality: Left;  ? COLONOSCOPY    ? [place]  2001  ? NOSE SURGERY  1994  ? open carpal tunnel release Right 2015  ? ? ?OB History   ?No obstetric history on file. ?  ? ? ? ?Home Medications   ? ?Prior to Admission medications   ?Medication Sig Start Date End Date Taking? Authorizing Provider  ?albuterol (PROVENTIL) (2.5 MG/3ML) 0.083% nebulizer solution Take 2.5 mg by nebulization every 4 (four) hours as needed. Last treatment around 2 am. 06/30/21 06/30/22 Yes [provider]  ?albuterol (VENTOLIN HFA) 108 (90 Base) MCG/ACT inhaler Inhale 2 puffs into the lungs every 4 (four) hours as needed. Last dose "this morning" 08/06/20  Yes [provider]  ?benzonatate (TESSALON) 200 MG capsule Take by mouth. 11/02/21  Yes [provider]  ?doxycycline (VIBRAMYCIN) 100 MG capsule Take 1 capsule (100 mg total) by mouth 2 (two) times daily. 01/27/22  Yes Caryl Ada K, PA-C  ?predniSONE (DELTASONE) 50 MG tablet One tablet a day 01/27/22  Yes Caryl Ada K, PA-C  ?acetaminophen (TYLENOL) 325 MG tablet Take by mouth.    [provider]  ?aspirin 81 MG EC tablet Take by mouth.  [provider]  ?atorvastatin (LIPITOR) 40 MG tablet Take 40 mg by mouth daily. 05/12/21   [provider]  ?baclofen (LIORESAL) 10 MG tablet Take 1 tablet (10 mg total) by mouth 3 (three) times daily. 08/19/21   Rodriguez-South[place], Sunday Spillers, PA-C  ?benazepril (LOTENSIN) 40 MG tablet Take 40 mg by mouth daily.    [provider]  ?buPROPion (WELLBUTRIN XL) 150 MG 24 hr tablet Take 150 mg by mouth daily. 05/06/21   [provider]  ?chlorpheniramine-HYDROcodone 10-8 MG/5ML  hydrocodone 10 mg-chlorpheniramine 8 mg/5 mL oral susp extend.rel 12hr    [provider]  ?chlorzoxazone (PARAFON) 500 MG tablet Take 500 mg by mouth 3 (three) times daily as needed. 12/15/21   [provider]  ?citalopram (CELEXA) 40 MG tablet Take 40 mg by mouth daily.    [provider]  ?Cysteamine Bitartrate (PROCYSBI) 300 MG PACK daily. 02/03/21 02/03/22  [provider]  ?diphenhydrAMINE (BENADRYL) 25 mg capsule Take by mouth.    [provider]  ?fluticasone (FLONASE) 50 MCG/ACT nasal spray Place into the nose. 01/29/21 01/29/22  [provider]  ?fluticasone-salmeterol (ADVAIR) 250-50 MCG/ACT AEPB Inhale into the lungs. 01/26/21 01/26/22  [provider]  ?glimepiride (AMARYL) 2 MG tablet Take 2 mg by mouth daily with breakfast.    [provider]  ?glucose blood (PRECISION QID TEST) test strip 1 each (1 strip total) 2 (two) times daily Use as instructed. 02/03/21   [provider]  ?HYDROcodone-acetaminophen (NORCO/VICODIN) 5-325 MG tablet hydrocodone 5 mg-acetaminophen 325 mg tablet    [provider]  ?ibuprofen (ADVIL,MOTRIN) 200 MG tablet Take 400-800 mg by mouth every 8 (eight) hours as needed.    [provider]  ?insulin detemir (LEVEMIR FLEXTOUCH) 100 UNIT/ML FlexPen Levemir FlexTouch U-100 Insulin 100 unit/mL (3 mL) subcutaneous pen    [provider]  ?Insulin Pen Needle (B-D UF III MINI PEN NEEDLES) 31G X 5 MM MISC Use once daily with insulin pen as directed. 11/06/19   [provider]  ?LANTUS SOLOSTAR 100 UNIT/ML Solostar Pen SMARTSIG:12 Unit(s) SUB-Q Daily 05/11/21   [provider]  ?metFORMIN (GLUCOPHAGE-XR) 500 MG 24 hr tablet Take 1,000 mg by mouth 2 (two) times daily with a meal.    [provider]  ?pantoprazole (PROTONIX) 40 MG tablet Take 1 tablet (40 mg total) by mouth 2 (two) times daily. 05/29/21   [name], DO  ?potassium chloride SA (K-DUR,KLOR-CON) 20  MEQ tablet Take 20 mEq by mouth 3 (three) times daily.    [provider]  ?SPIRIVA HANDIHALER 18 MCG inhalation capsule 1 capsule daily. 04/23/21   [provider]  ?sucralfate (CARAFATE) 1 g tablet Take 1 g by mouth 4 (four) times daily. 08/27/21   [provider]  ?traMADol (ULTRAM) 50 MG tablet tramadol 50 mg tablet    [provider]  ?triamterene-hydrochlorothiazide (MAXZIDE-25) 37.5-25 MG tablet Take 1 tablet by mouth daily.    [provider]  ?valACYclovir (VALTREX) 500 MG tablet valacyclovir 500 mg tablet    [provider]  ?zolpidem (AMBIEN) 10 MG tablet Take 10 mg by mouth at bedtime as needed for sleep.    [provider]  ?estradiol (ESTRACE) 0.5 MG tablet Take 0.5 mg by mouth daily.  10/09/19  [provider]  ?medroxyPROGESTERone (PROVERA) 2.5 MG tablet Take 2.5 mg by mouth daily.  10/09/19  [provider]  ? ? ?Family History ?Family History  ?Problem Relation Age of Onset  ?  Heart attack Mother   ? Diabetes Mother   ? Hypertension Mother   ? Stroke Father   ? Hypertension Father   ? Diabetes Father   ? Breast cancer Neg Hx   ? ? ?Social History ?Social History  ? ?Tobacco Use  ? Smoking status: Former  ?  Types: Cigarettes  ?  Quit date: 1997  ?  Years since quitting: 26.2  ? Smokeless tobacco: Never  ?Vaping Use  ? Vaping Use: Never used  ?Substance Use Topics  ? Alcohol use: No  ? Drug use: No  ? ? ? ?Allergies   ?Patient has no known allergies. ? ? ?Review of Systems ?Review of Systems  ?Respiratory:  Positive for cough.   ?All other systems reviewed and are negative. ? ? ?Physical Exam ?Triage Vital Signs ?ED Triage Vitals  ?Enc Vitals Group  ?   BP 01/27/22 1100 134/86  ?   Pulse Rate 01/27/22 1100 80  ?   Resp 01/27/22 1100 20  ?   Temp 01/27/22 1100 98.5 ?F (36.9 ?C)  ?   Temp Source 01/27/22 1100 Oral  ?   SpO2 01/27/22 1100 97 %  ?   Weight 01/27/22 1056 225 lb 1.4 oz (102.1 kg)  ?   Height 01/27/22 1056 '5\' 6"'$   (1.676 m)  ?   Head Circumference --   ?   Peak Flow --   ?   Pain Score 01/27/22 1054 0  ?   Pain Loc --   ?   Pain Edu? --   ?   Excl. in [place]? --   ? ?No data found. ? ?Updated Vital Signs ?BP 134/86 (BP

## 2022-01-27 NOTE — ED Triage Notes (Signed)
COVID19 test on Monday "Negative".  ?

## 2022-01-27 NOTE — Discharge Instructions (Addendum)
See your Physician for recheck in 1 week  °

## 2022-01-29 DIAGNOSIS — G4733 Obstructive sleep apnea (adult) (pediatric): Secondary | ICD-10-CM | POA: Diagnosis not present

## 2022-02-05 DIAGNOSIS — R052 Subacute cough: Secondary | ICD-10-CM | POA: Diagnosis not present

## 2022-02-05 DIAGNOSIS — J310 Chronic rhinitis: Secondary | ICD-10-CM | POA: Diagnosis not present

## 2022-02-05 DIAGNOSIS — J449 Chronic obstructive pulmonary disease, unspecified: Secondary | ICD-10-CM | POA: Diagnosis not present

## 2022-02-05 DIAGNOSIS — G4733 Obstructive sleep apnea (adult) (pediatric): Secondary | ICD-10-CM | POA: Diagnosis not present

## 2022-02-28 DIAGNOSIS — G4733 Obstructive sleep apnea (adult) (pediatric): Secondary | ICD-10-CM | POA: Diagnosis not present

## 2022-03-02 DIAGNOSIS — E669 Obesity, unspecified: Secondary | ICD-10-CM | POA: Diagnosis not present

## 2022-03-02 DIAGNOSIS — Z1211 Encounter for screening for malignant neoplasm of colon: Secondary | ICD-10-CM | POA: Diagnosis not present

## 2022-03-02 DIAGNOSIS — D649 Anemia, unspecified: Secondary | ICD-10-CM | POA: Diagnosis not present

## 2022-03-02 DIAGNOSIS — Z79899 Other long term (current) drug therapy: Secondary | ICD-10-CM | POA: Diagnosis not present

## 2022-03-02 DIAGNOSIS — E785 Hyperlipidemia, unspecified: Secondary | ICD-10-CM | POA: Diagnosis not present

## 2022-03-02 DIAGNOSIS — E1169 Type 2 diabetes mellitus with other specified complication: Secondary | ICD-10-CM | POA: Diagnosis not present

## 2022-03-02 DIAGNOSIS — E1159 Type 2 diabetes mellitus with other circulatory complications: Secondary | ICD-10-CM | POA: Diagnosis not present

## 2022-03-02 DIAGNOSIS — I152 Hypertension secondary to endocrine disorders: Secondary | ICD-10-CM | POA: Diagnosis not present

## 2022-03-02 DIAGNOSIS — Z794 Long term (current) use of insulin: Secondary | ICD-10-CM | POA: Diagnosis not present

## 2022-03-02 DIAGNOSIS — R69 Illness, unspecified: Secondary | ICD-10-CM | POA: Diagnosis not present

## 2022-03-02 DIAGNOSIS — K219 Gastro-esophageal reflux disease without esophagitis: Secondary | ICD-10-CM | POA: Diagnosis not present

## 2022-03-02 DIAGNOSIS — G4733 Obstructive sleep apnea (adult) (pediatric): Secondary | ICD-10-CM | POA: Diagnosis not present

## 2022-03-02 DIAGNOSIS — J449 Chronic obstructive pulmonary disease, unspecified: Secondary | ICD-10-CM | POA: Diagnosis not present

## 2022-03-02 DIAGNOSIS — E11319 Type 2 diabetes mellitus with unspecified diabetic retinopathy without macular edema: Secondary | ICD-10-CM | POA: Diagnosis not present

## 2022-03-04 DIAGNOSIS — J310 Chronic rhinitis: Secondary | ICD-10-CM | POA: Diagnosis not present

## 2022-03-04 DIAGNOSIS — R052 Subacute cough: Secondary | ICD-10-CM | POA: Diagnosis not present

## 2022-03-04 DIAGNOSIS — G4733 Obstructive sleep apnea (adult) (pediatric): Secondary | ICD-10-CM | POA: Diagnosis not present

## 2022-03-04 DIAGNOSIS — J449 Chronic obstructive pulmonary disease, unspecified: Secondary | ICD-10-CM | POA: Diagnosis not present

## 2022-03-31 DIAGNOSIS — G4733 Obstructive sleep apnea (adult) (pediatric): Secondary | ICD-10-CM | POA: Diagnosis not present

## 2022-04-03 DIAGNOSIS — G4733 Obstructive sleep apnea (adult) (pediatric): Secondary | ICD-10-CM | POA: Diagnosis not present

## 2022-04-28 DIAGNOSIS — R1012 Left upper quadrant pain: Secondary | ICD-10-CM | POA: Diagnosis not present

## 2022-04-28 DIAGNOSIS — R1013 Epigastric pain: Secondary | ICD-10-CM | POA: Diagnosis not present

## 2022-04-28 DIAGNOSIS — R1314 Dysphagia, pharyngoesophageal phase: Secondary | ICD-10-CM | POA: Diagnosis not present

## 2022-04-28 DIAGNOSIS — R1032 Left lower quadrant pain: Secondary | ICD-10-CM | POA: Diagnosis not present

## 2022-04-28 DIAGNOSIS — D649 Anemia, unspecified: Secondary | ICD-10-CM | POA: Diagnosis not present

## 2022-04-29 ENCOUNTER — Other Ambulatory Visit: Payer: Self-pay | Admitting: Gastroenterology

## 2022-04-29 DIAGNOSIS — R1013 Epigastric pain: Secondary | ICD-10-CM

## 2022-04-29 DIAGNOSIS — R1032 Left lower quadrant pain: Secondary | ICD-10-CM

## 2022-04-29 DIAGNOSIS — R1012 Left upper quadrant pain: Secondary | ICD-10-CM

## 2022-04-30 DIAGNOSIS — G4733 Obstructive sleep apnea (adult) (pediatric): Secondary | ICD-10-CM | POA: Diagnosis not present

## 2022-05-05 ENCOUNTER — Ambulatory Visit
Admission: RE | Admit: 2022-05-05 | Discharge: 2022-05-05 | Disposition: A | Discharge status: Home / Self Care | Payer: 59 | Attending: Internal Medicine | Admitting: Internal Medicine

## 2022-05-05 ENCOUNTER — Ambulatory Visit: Payer: 59 | Admitting: Anesthesiology

## 2022-05-05 ENCOUNTER — Encounter
Admission: RE | Disposition: A | Discharge status: Home / Self Care | Payer: Self-pay | Source: Home / Self Care | Attending: Internal Medicine

## 2022-05-05 ENCOUNTER — Encounter: Payer: Self-pay | Admitting: Internal Medicine

## 2022-05-05 DIAGNOSIS — K648 Other hemorrhoids: Secondary | ICD-10-CM | POA: Diagnosis not present

## 2022-05-05 DIAGNOSIS — R1314 Dysphagia, pharyngoesophageal phase: Secondary | ICD-10-CM | POA: Insufficient documentation

## 2022-05-05 DIAGNOSIS — R1012 Left upper quadrant pain: Secondary | ICD-10-CM | POA: Insufficient documentation

## 2022-05-05 DIAGNOSIS — R1032 Left lower quadrant pain: Secondary | ICD-10-CM | POA: Diagnosis not present

## 2022-05-05 DIAGNOSIS — R1310 Dysphagia, unspecified: Secondary | ICD-10-CM | POA: Diagnosis not present

## 2022-05-05 DIAGNOSIS — D175 Benign lipomatous neoplasm of intra-abdominal organs: Secondary | ICD-10-CM | POA: Insufficient documentation

## 2022-05-05 DIAGNOSIS — K222 Esophageal obstruction: Secondary | ICD-10-CM | POA: Insufficient documentation

## 2022-05-05 DIAGNOSIS — R194 Change in bowel habit: Secondary | ICD-10-CM | POA: Insufficient documentation

## 2022-05-05 DIAGNOSIS — J45909 Unspecified asthma, uncomplicated: Secondary | ICD-10-CM | POA: Diagnosis not present

## 2022-05-05 DIAGNOSIS — K2100 Gastro-esophageal reflux disease with esophagitis, without bleeding: Secondary | ICD-10-CM | POA: Diagnosis not present

## 2022-05-05 DIAGNOSIS — R1030 Lower abdominal pain, unspecified: Secondary | ICD-10-CM | POA: Diagnosis not present

## 2022-05-05 DIAGNOSIS — K2289 Other specified disease of esophagus: Secondary | ICD-10-CM | POA: Diagnosis not present

## 2022-05-05 DIAGNOSIS — B3781 Candidal esophagitis: Secondary | ICD-10-CM | POA: Diagnosis not present

## 2022-05-05 DIAGNOSIS — K640 First degree hemorrhoids: Secondary | ICD-10-CM | POA: Insufficient documentation

## 2022-05-05 DIAGNOSIS — K5730 Diverticulosis of large intestine without perforation or abscess without bleeding: Secondary | ICD-10-CM | POA: Diagnosis not present

## 2022-05-05 DIAGNOSIS — E119 Type 2 diabetes mellitus without complications: Secondary | ICD-10-CM | POA: Diagnosis not present

## 2022-05-05 DIAGNOSIS — Q438 Other specified congenital malformations of intestine: Secondary | ICD-10-CM | POA: Insufficient documentation

## 2022-05-05 DIAGNOSIS — I10 Essential (primary) hypertension: Secondary | ICD-10-CM | POA: Insufficient documentation

## 2022-05-05 HISTORY — PX: ESOPHAGOGASTRODUODENOSCOPY (EGD) WITH PROPOFOL: SHX5813

## 2022-05-05 HISTORY — PX: COLONOSCOPY WITH PROPOFOL: SHX5780

## 2022-05-05 LAB — GLUCOSE, CAPILLARY: Glucose-Capillary: 82 mg/dL (ref 70–99)

## 2022-05-05 LAB — KOH PREP

## 2022-05-05 SURGERY — COLONOSCOPY WITH PROPOFOL
Anesthesia: General

## 2022-05-05 MED ORDER — PROPOFOL 10 MG/ML IV BOLUS
INTRAVENOUS | Status: AC
  Filled 2022-05-05: qty 20

## 2022-05-05 MED ORDER — LIDOCAINE HCL (PF) 2 % IJ SOLN
INTRAMUSCULAR | Status: AC
  Filled 2022-05-05: qty 5

## 2022-05-05 MED ORDER — SODIUM CHLORIDE 0.9 % IV SOLN: INTRAVENOUS | Status: DC

## 2022-05-05 MED ORDER — PROPOFOL 500 MG/50ML IV EMUL
INTRAVENOUS | Status: DC | PRN
  Administered 2022-05-05: 150 ug/kg/min via INTRAVENOUS

## 2022-05-05 MED ORDER — LIDOCAINE HCL (CARDIAC) PF 100 MG/5ML IV SOSY
PREFILLED_SYRINGE | INTRAVENOUS | Status: DC | PRN
  Administered 2022-05-05: 100 mg via INTRAVENOUS

## 2022-05-05 NOTE — Op Note (Signed)
[facility] Patient Name: Debbie Powers Procedure Date: 05/05/2022 1:38 PM MRN: [id number] Account #: [id number] Date of Birth: Dec 03, 1960 Admit Type: Outpatient Age: 61 Room: [facility] ENDO ROOM 2 Gender: Female Note Status: Finalized Instrument Name: Upper Endoscope [id number] Procedure:             Upper GI endoscopy Indications:           Abdominal pain in the left upper quadrant, Abdominal                         pain in the left lower quadrant, Dysphagia, Suspected                         gastro-esophageal reflux disease Providers:             Benay Pike. Alice Reichert MD, MD Referring MD:          Christena Flake. Raechel Ache, MD (Referring MD) Medicines:             Propofol per Anesthesia Complications:         No immediate complications. Procedure:             Pre-Anesthesia Assessment:                        - The risks and benefits of the procedure and the                         sedation options and risks were discussed with the                         patient. All questions were answered and informed                         consent was obtained.                        - Patient identification and proposed procedure were                         verified prior to the procedure by the nurse. The                         procedure was verified in the procedure room.                        - ASA Grade Assessment: III - A patient with severe                         systemic disease.                        - After reviewing the risks and benefits, the patient                         was deemed in satisfactory condition to undergo the                         procedure.  After obtaining informed consent, the endoscope was                         passed under direct vision. Throughout the procedure,                         the patient's blood pressure, pulse, and oxygen                         saturations were monitored continuously. The  Endoscope                         was introduced through the mouth, and advanced to the                         third part of duodenum. The upper GI endoscopy was                         accomplished without difficulty. The patient tolerated                         the procedure well. Findings:      Diffuse, white plaques were found in the entire esophagus. Cells for       cytology were obtained by brushing.      One benign-appearing, intrinsic mild stenosis was found at the       gastroesophageal junction. This stenosis measured 1.7 cm (inner       diameter) x less than one cm (in length). The stenosis was traversed.       The lesion was not amenable to dilation, and this was not attempted.      The entire examined stomach was normal.      The cardia and gastric fundus were normal on retroflexion.      The examined duodenum was normal.      The exam of the esophagus was otherwise normal. Impression:            - Esophageal plaques were found, consistent with                         candidiasis. Cells for cytology obtained.                        - Benign-appearing esophageal stenosis. Lesion not                         amenable to dilation, and not attempted.                        - Normal stomach.                        - Normal examined duodenum. Recommendation:        - Await pathology results.                        - Proceed with colonoscopy Procedure Code(s):     --- Professional ---                        [phone], Esophagogastroduodenoscopy, flexible,  transoral; diagnostic, including collection of                         specimen(s) by brushing or washing, when performed                         (separate procedure) Diagnosis Code(s):     --- Professional ---                        R13.10, Dysphagia, unspecified                        R10.32, Left lower quadrant pain                        R10.12, Left upper quadrant pain                        K22.2,  Esophageal obstruction                        K22.9, Disease of esophagus, unspecified CPT copyright 2019 American Medical Association. All rights reserved. The codes documented in this report are preliminary and upon coder review may  be revised to meet current compliance requirements. Efrain Sella MD, MD 05/05/2022 2:03:02 PM This report has been signed electronically. Number of Addenda: 0 Note Initiated On: 05/05/2022 1:38 PM Estimated Blood Loss:  Estimated blood loss: none.      [facility]

## 2022-05-05 NOTE — Anesthesia Preprocedure Evaluation (Addendum)
Anesthesia Evaluation  Patient identified by MRN, date of birth, ID band Patient awake    Reviewed: Allergy & Precautions, NPO status , Patient's Chart, lab work & pertinent test results  Airway Mallampati: III  TM Distance: >3 FB Neck ROM: full    Dental  (+) Chipped   Pulmonary asthma , former smoker,    Pulmonary exam normal        Cardiovascular hypertension, negative cardio ROS Normal cardiovascular exam     Neuro/Psych PSYCHIATRIC DISORDERS negative neurological ROS     GI/Hepatic Neg liver ROS, GERD  ,  Endo/Other  negative endocrine ROSdiabetes  Renal/GU negative Renal ROS  negative genitourinary   Musculoskeletal   Abdominal   Peds  Hematology negative hematology ROS (+)   Anesthesia Other Findings Past Medical History: No date: Asthma No date: Bilateral carpal tunnel syndrome No date: Degenerative disc disease, cervical No date: Degenerative disc disease, lumbar No date: Depression No date: Diabetes mellitus type 2 with retinopathy (HCC) No date: GERD (gastroesophageal reflux disease) No date: History of herpes genitalis No date: Hyperlipidemia No date: Hypertension No date: Obesity No date: Osteoarthritis of both knees No date: Seasonal rhinitis No date: Wears dentures  Past Surgical History: 04/09/2016: CARPAL TUNNEL RELEASE; Left     Comment:  Procedure: LEFT OPEN CARPAL TUNNEL RELEASE;  Surgeon:               Leanor Kail, MD;  Location: [place];                Service: Orthopedics;  Laterality: Left; No date: COLONOSCOPY 2001: [place]: NOSE SURGERY 2015: open carpal tunnel release; Right     Reproductive/Obstetrics negative OB ROS                            Anesthesia Physical Anesthesia Plan  ASA: 2  Anesthesia Plan: General   Post-op Pain Management: Minimal or no pain anticipated   Induction: Intravenous  PONV  Risk Score and Plan: 3 and Propofol infusion, TIVA and Ondansetron  Airway Management Planned: Natural Airway and Nasal Cannula  Additional Equipment: None  Intra-op Plan:   Post-operative Plan:   Informed Consent: I have reviewed the patients History and Physical, chart, labs and discussed the procedure including the risks, benefits and alternatives for the proposed anesthesia with the patient or authorized representative who has indicated his/her understanding and acceptance.     Dental Advisory Given  Plan Discussed with: Anesthesiologist, CRNA and Surgeon  Anesthesia Plan Comments: (Patient consented for risks of anesthesia including but not limited to:  - adverse reactions to medications - risk of airway placement if required - damage to eyes, teeth, lips or other oral mucosa - nerve damage due to positioning  - sore throat or hoarseness - Damage to heart, brain, nerves, lungs, other parts of body or loss of life  Patient voiced understanding.)        Anesthesia Quick Evaluation

## 2022-05-05 NOTE — Anesthesia Procedure Notes (Signed)
Date/Time: 05/05/2022 2:02 PM  Performed by: Donalda Ewings, CindyPre-anesthesia Checklist: Patient identified, Emergency Drugs available, Suction available, Patient being monitored and Timeout performed Patient Re-evaluated:Patient Re-evaluated prior to induction Oxygen Delivery Method: Nasal cannula Preoxygenation: Pre-oxygenation with 100% oxygen Induction Type: IV induction Airway Equipment and Method: Bite block Placement Confirmation: positive ETCO2 and CO2 detector

## 2022-05-05 NOTE — Interval H&P Note (Signed)
History and Physical Interval Note:  05/05/2022 1:15 PM  Debbie Powers  has presented today for surgery, with the diagnosis of LUQ pain. LLQ pain,dydpepsia,anemia,pharyngoesophageal dysphagia.  The various methods of treatment have been discussed with the patient and family. After consideration of risks, benefits and other options for treatment, the patient has consented to  Procedure(s): COLONOSCOPY WITH PROPOFOL (N/A) ESOPHAGOGASTRODUODENOSCOPY (EGD) WITH PROPOFOL (N/A) as a surgical intervention.  The patient's history has been reviewed, patient examined, no change in status, stable for surgery.  I have reviewed the patient's chart and labs.  Questions were answered to the patient's satisfaction.     [place], [place]

## 2022-05-05 NOTE — H&P (Signed)
Outpatient short stay form Pre-procedure 05/05/2022 1:14 PM Debbie Powers, M.D.  Primary Physician: Debbie Powers, M.D.  Reason for visit:  Change in bowel habits, GERD, dysphagia, abdominal pain.  History of present illness:    Ms. Debbie Powers is a 61 y.o. female presenting for consultation for microcytic anemia, bowel habit changes, gerd/dysphagia, abdominal pain. Reviewed how to take ppi for max benefit. Add otc pepcid prior to dinner. CBC, ferritin, iron panel. CT A/P.  No current facility-administered medications for this encounter.  Medications Prior to Admission  Medication Sig Dispense Refill Last Dose   acetaminophen (TYLENOL) 325 MG tablet Take by mouth.      albuterol (PROVENTIL) (2.5 MG/3ML) 0.083% nebulizer solution Take 2.5 mg by nebulization every 4 (four) hours as needed. Last treatment around 2 am.      albuterol (VENTOLIN HFA) 108 (90 Base) MCG/ACT inhaler Inhale 2 puffs into the lungs every 4 (four) hours as needed. Last dose "this morning"      aspirin 81 MG EC tablet Take by mouth.      atorvastatin (LIPITOR) 40 MG tablet Take 40 mg by mouth daily.      baclofen (LIORESAL) 10 MG tablet Take 1 tablet (10 mg total) by mouth 3 (three) times daily. 20 each 0    benazepril (LOTENSIN) 40 MG tablet Take 40 mg by mouth daily.      benzonatate (TESSALON) 200 MG capsule Take by mouth.      buPROPion (WELLBUTRIN XL) 150 MG 24 hr tablet Take 150 mg by mouth daily.      chlorpheniramine-HYDROcodone 10-8 MG/5ML hydrocodone 10 mg-chlorpheniramine 8 mg/5 mL oral susp extend.rel 12hr      chlorzoxazone (PARAFON) 500 MG tablet Take 500 mg by mouth 3 (three) times daily as needed.      citalopram (CELEXA) 40 MG tablet Take 40 mg by mouth daily.      diphenhydrAMINE (BENADRYL) 25 mg capsule Take by mouth.      doxycycline (VIBRAMYCIN) 100 MG capsule Take 1 capsule (100 mg total) by mouth 2 (two) times daily. 20 capsule 0    fluticasone (FLONASE) 50 MCG/ACT nasal spray Place into the  nose.      fluticasone-salmeterol (ADVAIR) 250-50 MCG/ACT AEPB Inhale into the lungs.      glimepiride (AMARYL) 2 MG tablet Take 2 mg by mouth daily with breakfast.      glucose blood (PRECISION QID TEST) test strip 1 each (1 strip total) 2 (two) times daily Use as instructed.      HYDROcodone-acetaminophen (NORCO/VICODIN) 5-325 MG tablet hydrocodone 5 mg-acetaminophen 325 mg tablet      ibuprofen (ADVIL,MOTRIN) 200 MG tablet Take 400-800 mg by mouth every 8 (eight) hours as needed.      insulin detemir (LEVEMIR FLEXTOUCH) 100 UNIT/ML FlexPen Levemir FlexTouch U-100 Insulin 100 unit/mL (3 mL) subcutaneous pen      Insulin Pen Needle (B-D UF III MINI PEN NEEDLES) 31G X 5 MM MISC Use once daily with insulin pen as directed.      LANTUS SOLOSTAR 100 UNIT/ML Solostar Pen SMARTSIG:12 Unit(s) SUB-Q Daily      metFORMIN (GLUCOPHAGE-XR) 500 MG 24 hr tablet Take 1,000 mg by mouth 2 (two) times daily with a meal.      pantoprazole (PROTONIX) 40 MG tablet Take 1 tablet (40 mg total) by mouth 2 (two) times daily. 60 tablet 0    potassium chloride SA (K-DUR,KLOR-CON) 20 MEQ tablet Take 20 mEq by mouth 3 (three) times daily.  predniSONE (DELTASONE) 50 MG tablet One tablet a day 5 tablet 0    SPIRIVA HANDIHALER 18 MCG inhalation capsule 1 capsule daily.      sucralfate (CARAFATE) 1 g tablet Take 1 g by mouth 4 (four) times daily.      traMADol (ULTRAM) 50 MG tablet tramadol 50 mg tablet      triamterene-hydrochlorothiazide (MAXZIDE-25) 37.5-25 MG tablet Take 1 tablet by mouth daily.      valACYclovir (VALTREX) 500 MG tablet valacyclovir 500 mg tablet      zolpidem (AMBIEN) 10 MG tablet Take 10 mg by mouth at bedtime as needed for sleep.        No Known Allergies   Past Medical History:  Diagnosis Date   Asthma    Bilateral carpal tunnel syndrome    Degenerative disc disease, cervical    Degenerative disc disease, lumbar    Depression    Diabetes mellitus type 2 with retinopathy (HCC)    GERD  (gastroesophageal reflux disease)    History of herpes genitalis    Hyperlipidemia    Hypertension    Obesity    Osteoarthritis of both knees    Seasonal rhinitis    Wears dentures     Review of systems:  Otherwise negative.    Physical Exam  Gen: Alert, oriented. Appears stated age.  HEENT: [place]/AT. PERRLA. Lungs: CTA, no wheezes. CV: RR nl S1, S2. Abd: soft, benign, no masses. BS+ Ext: No edema. Pulses 2+    Planned procedures: Proceed with EGD and  colonoscopy. The patient understands the nature of the planned procedure, indications, risks, alternatives and potential complications including but not limited to bleeding, infection, perforation, damage to internal organs and possible oversedation/side effects from anesthesia. The patient agrees and gives consent to proceed.  Please refer to procedure notes for findings, recommendations and patient disposition/instructions.     Debbie Powers, M.D. Gastroenterology 05/05/2022  1:14 PM

## 2022-05-05 NOTE — Op Note (Signed)
[facility] Patient Name: Debbie Powers Procedure Date: 05/05/2022 1:37 PM MRN: [id number] Account #: [id number] Date of Birth: 1961-01-18 Admit Type: Outpatient Age: 61 Room: [facility] ENDO ROOM 2 Gender: Female Note Status: Finalized Instrument Name: Jasper Riling [id number] Procedure:             Colonoscopy Indications:           Change in bowel habits Providers:             Benay Pike. Alice Reichert MD, MD Referring MD:          Christena Flake. Raechel Ache, MD (Referring MD) Medicines:             Propofol per Anesthesia Complications:         No immediate complications. Procedure:             Pre-Anesthesia Assessment:                        - The risks and benefits of the procedure and the                         sedation options and risks were discussed with the                         patient. All questions were answered and informed                         consent was obtained.                        - Patient identification and proposed procedure were                         verified prior to the procedure by the nurse. The                         procedure was verified in the procedure room.                        - ASA Grade Assessment: III - A patient with severe                         systemic disease.                        - After reviewing the risks and benefits, the patient                         was deemed in satisfactory condition to undergo the                         procedure.                        After obtaining informed consent, the colonoscope was                         passed under direct vision. Throughout the procedure,                         the  patient's blood pressure, pulse, and oxygen                         saturations were monitored continuously. The                         Colonoscope was introduced through the anus and                         advanced to the the cecum, identified by appendiceal                         orifice  and ileocecal valve. The patient tolerated the                         procedure well. The colonoscopy was technically                         difficult and complex due to a redundant colon and                         significant looping. Successful completion of the                         procedure was aided by applying abdominal pressure.                         The patient tolerated the procedure well. The quality                         of the bowel preparation was good. The ileocecal                         valve, appendiceal orifice, and rectum were                         photographed. Findings:      The perianal and digital rectal examinations were normal. Pertinent       negatives include normal sphincter tone and no palpable rectal lesions.      Non-bleeding internal hemorrhoids were found during retroflexion. The       hemorrhoids were Grade I (internal hemorrhoids that do not prolapse).      There was a small lipoma, 18 mm in diameter, in the transverse colon.       This was biopsied with a cold forceps for histology.      Many small-mouthed diverticula were found in the sigmoid colon.      The exam was otherwise without abnormality. Impression:            - Non-bleeding internal hemorrhoids.                        - Small lipoma in the transverse colon. Biopsied.                        - Diverticulosis in the sigmoid colon.                        - The examination was otherwise normal. Recommendation:        -  Await pathology results from EGD, also performed                         today.                        - Patient has a contact number available for                         emergencies. The signs and symptoms of potential                         delayed complications were discussed with the patient.                         Return to normal activities tomorrow. Written                         discharge instructions were provided to the patient.                        -  Resume previous diet.                        - Continue present medications.                        - Await pathology results.                        - Repeat colonoscopy in 10 years for screening                         purposes.                        - Return to GI office at appointment to be scheduled.                        - Telephone GI office to schedule appointment.                        - The findings and recommendations were discussed with                         the patient. Procedure Code(s):     --- Professional ---                        [phone], Colonoscopy, flexible; with biopsy, single or                         multiple Diagnosis Code(s):     --- Professional ---                        K57.30, Diverticulosis of large intestine without                         perforation or abscess without bleeding                        R19.4, Change  in bowel habit                        K64.0, First degree hemorrhoids                        D17.5, Benign lipomatous neoplasm of intra-abdominal                         organs CPT copyright 2019 American [employer] Association. All rights reserved. The codes documented in this report are preliminary and upon coder review may  be revised to meet current compliance requirements. Efrain Sella MD, MD 05/05/2022 2:27:55 PM This report has been signed electronically. Number of Addenda: 0 Note Initiated On: 05/05/2022 1:37 PM Scope Withdrawal Time: 0 hours 6 minutes 47 seconds  Total Procedure Duration: 0 hours 17 minutes 52 seconds  Estimated Blood Loss:  Estimated blood loss: none.      [facility]

## 2022-05-05 NOTE — Transfer of Care (Signed)
Immediate Anesthesia Transfer of Care Note  Patient: Debbie Powers  Procedure(s) Performed: COLONOSCOPY WITH PROPOFOL ESOPHAGOGASTRODUODENOSCOPY (EGD) WITH PROPOFOL  Patient Location: PACU  Anesthesia Type:General  Level of Consciousness: awake and sedated  Airway & Oxygen Therapy: Patient Spontanous Breathing  Post-op Assessment: Report given to RN and Post -op Vital signs reviewed and stable  Post vital signs: Reviewed and stable  Last Vitals:  Vitals Value Taken Time  BP    Temp    Pulse    Resp    SpO2      Last Pain:  Vitals:   05/05/22 1329  TempSrc: Temporal         Complications: No notable events documented.

## 2022-05-05 NOTE — Anesthesia Postprocedure Evaluation (Signed)
Anesthesia Post Note  Patient: Debbie Powers  Procedure(s) Performed: COLONOSCOPY WITH PROPOFOL ESOPHAGOGASTRODUODENOSCOPY (EGD) WITH PROPOFOL  Patient location during evaluation: Endoscopy Anesthesia Type: General Level of consciousness: awake and alert Pain management: pain level controlled Vital Signs Assessment: post-procedure vital signs reviewed and stable Respiratory status: spontaneous breathing, nonlabored ventilation, respiratory function stable and patient connected to nasal cannula oxygen Cardiovascular status: blood pressure returned to baseline and stable Postop Assessment: no apparent nausea or vomiting Anesthetic complications: no   No notable events documented.   Last Vitals:  Vitals:   05/05/22 1329 05/05/22 1427  BP: (!) 160/84 113/83  Pulse: 74 74  Resp: 16 18  Temp: (!) 35.9 C (!) 36.1 C  SpO2: 100% 100%    Last Pain:  Vitals:   05/05/22 1427  TempSrc: Temporal  PainSc: 0-No pain                 Dimas Millin

## 2022-05-06 ENCOUNTER — Encounter: Payer: Self-pay | Admitting: Internal Medicine

## 2022-05-07 LAB — SURGICAL PATHOLOGY

## 2022-05-10 ENCOUNTER — Ambulatory Visit
Admission: RE | Admit: 2022-05-10 | Discharge: 2022-05-10 | Disposition: A | Discharge status: Home / Self Care | Payer: 59 | Source: Ambulatory Visit | Attending: Gastroenterology | Admitting: Gastroenterology

## 2022-05-10 DIAGNOSIS — R1032 Left lower quadrant pain: Secondary | ICD-10-CM

## 2022-05-10 DIAGNOSIS — R1012 Left upper quadrant pain: Secondary | ICD-10-CM | POA: Diagnosis not present

## 2022-05-10 DIAGNOSIS — R1013 Epigastric pain: Secondary | ICD-10-CM

## 2022-05-10 DIAGNOSIS — R1031 Right lower quadrant pain: Secondary | ICD-10-CM | POA: Diagnosis not present

## 2022-05-10 LAB — POCT I-STAT CREATININE: Creatinine, Ser: 1.2 mg/dL — ABNORMAL HIGH (ref 0.44–1.00)

## 2022-05-10 MED ORDER — IOHEXOL 300 MG/ML  SOLN
100.0000 mL | Freq: Once | INTRAMUSCULAR | Status: AC | PRN
  Administered 2022-05-10: 100 mL via INTRAVENOUS

## 2022-05-19 DIAGNOSIS — Z9989 Dependence on other enabling machines and devices: Secondary | ICD-10-CM | POA: Diagnosis not present

## 2022-05-19 DIAGNOSIS — G4733 Obstructive sleep apnea (adult) (pediatric): Secondary | ICD-10-CM | POA: Diagnosis not present

## 2022-05-19 DIAGNOSIS — J449 Chronic obstructive pulmonary disease, unspecified: Secondary | ICD-10-CM | POA: Diagnosis not present

## 2022-05-19 DIAGNOSIS — J301 Allergic rhinitis due to pollen: Secondary | ICD-10-CM | POA: Diagnosis not present

## 2022-05-19 DIAGNOSIS — R053 Chronic cough: Secondary | ICD-10-CM | POA: Diagnosis not present

## 2022-05-31 DIAGNOSIS — G4733 Obstructive sleep apnea (adult) (pediatric): Secondary | ICD-10-CM | POA: Diagnosis not present

## 2022-06-24 DIAGNOSIS — G4733 Obstructive sleep apnea (adult) (pediatric): Secondary | ICD-10-CM | POA: Diagnosis not present

## 2022-06-24 DIAGNOSIS — R69 Illness, unspecified: Secondary | ICD-10-CM | POA: Diagnosis not present

## 2022-06-24 DIAGNOSIS — Z79899 Other long term (current) drug therapy: Secondary | ICD-10-CM | POA: Diagnosis not present

## 2022-06-24 DIAGNOSIS — J449 Chronic obstructive pulmonary disease, unspecified: Secondary | ICD-10-CM | POA: Diagnosis not present

## 2022-06-24 DIAGNOSIS — D509 Iron deficiency anemia, unspecified: Secondary | ICD-10-CM | POA: Diagnosis not present

## 2022-06-24 DIAGNOSIS — E11319 Type 2 diabetes mellitus with unspecified diabetic retinopathy without macular edema: Secondary | ICD-10-CM | POA: Diagnosis not present

## 2022-06-24 DIAGNOSIS — E1159 Type 2 diabetes mellitus with other circulatory complications: Secondary | ICD-10-CM | POA: Diagnosis not present

## 2022-06-24 DIAGNOSIS — E1169 Type 2 diabetes mellitus with other specified complication: Secondary | ICD-10-CM | POA: Diagnosis not present

## 2022-06-24 DIAGNOSIS — Z794 Long term (current) use of insulin: Secondary | ICD-10-CM | POA: Diagnosis not present

## 2022-06-24 DIAGNOSIS — K219 Gastro-esophageal reflux disease without esophagitis: Secondary | ICD-10-CM | POA: Diagnosis not present

## 2022-07-01 DIAGNOSIS — G4733 Obstructive sleep apnea (adult) (pediatric): Secondary | ICD-10-CM | POA: Diagnosis not present

## 2022-07-02 DIAGNOSIS — G4733 Obstructive sleep apnea (adult) (pediatric): Secondary | ICD-10-CM | POA: Diagnosis not present

## 2022-07-31 DIAGNOSIS — G4733 Obstructive sleep apnea (adult) (pediatric): Secondary | ICD-10-CM | POA: Diagnosis not present

## 2022-08-31 DIAGNOSIS — G4733 Obstructive sleep apnea (adult) (pediatric): Secondary | ICD-10-CM | POA: Diagnosis not present

## 2022-09-20 DIAGNOSIS — G4733 Obstructive sleep apnea (adult) (pediatric): Secondary | ICD-10-CM | POA: Diagnosis not present

## 2022-09-20 DIAGNOSIS — J301 Allergic rhinitis due to pollen: Secondary | ICD-10-CM | POA: Diagnosis not present

## 2022-09-20 DIAGNOSIS — J449 Chronic obstructive pulmonary disease, unspecified: Secondary | ICD-10-CM | POA: Diagnosis not present

## 2022-09-30 DIAGNOSIS — G4733 Obstructive sleep apnea (adult) (pediatric): Secondary | ICD-10-CM | POA: Diagnosis not present

## 2022-10-04 ENCOUNTER — Encounter: Payer: Self-pay | Admitting: Emergency Medicine

## 2022-10-04 ENCOUNTER — Ambulatory Visit
Admission: EM | Admit: 2022-10-04 | Discharge: 2022-10-04 | Disposition: A | Discharge status: Home / Self Care | Payer: 59 | Attending: Internal Medicine | Admitting: Internal Medicine

## 2022-10-04 ENCOUNTER — Ambulatory Visit (INDEPENDENT_AMBULATORY_CARE_PROVIDER_SITE_OTHER): Payer: 59

## 2022-10-04 DIAGNOSIS — R0602 Shortness of breath: Secondary | ICD-10-CM

## 2022-10-04 DIAGNOSIS — J322 Chronic ethmoidal sinusitis: Secondary | ICD-10-CM | POA: Diagnosis not present

## 2022-10-04 DIAGNOSIS — R509 Fever, unspecified: Secondary | ICD-10-CM | POA: Diagnosis not present

## 2022-10-04 DIAGNOSIS — Z1152 Encounter for screening for COVID-19: Secondary | ICD-10-CM | POA: Diagnosis not present

## 2022-10-04 DIAGNOSIS — J440 Chronic obstructive pulmonary disease with acute lower respiratory infection: Secondary | ICD-10-CM | POA: Diagnosis not present

## 2022-10-04 DIAGNOSIS — J209 Acute bronchitis, unspecified: Secondary | ICD-10-CM | POA: Insufficient documentation

## 2022-10-04 DIAGNOSIS — R059 Cough, unspecified: Secondary | ICD-10-CM

## 2022-10-04 LAB — RESP PANEL BY RT-PCR (RSV, FLU A&B, COVID)  RVPGX2
Influenza A by PCR: NEGATIVE
Influenza B by PCR: NEGATIVE
Resp Syncytial Virus by PCR: NEGATIVE
SARS Coronavirus 2 by RT PCR: NEGATIVE

## 2022-10-04 MED ORDER — AMOXICILLIN-POT CLAVULANATE 875-125 MG PO TABS: 1.0000 | ORAL_TABLET | Freq: Two times a day (BID) | ORAL | 0 refills | Status: DC

## 2022-10-04 MED ORDER — ACETAMINOPHEN 500 MG PO TABS
1000.0000 mg | ORAL_TABLET | Freq: Once | ORAL | Status: AC
  Administered 2022-10-04: 1000 mg via ORAL

## 2022-10-04 NOTE — ED Notes (Signed)
Checked O2 at registration 99% on room air

## 2022-10-04 NOTE — Discharge Instructions (Addendum)
I believe you have a secondary infection from what ever virus you started with If you get worse in the next 2-3 days, please go to ER to have blood work and more test done.  Continue using your nebulizer every 4 hours for cough and wheezing

## 2022-10-04 NOTE — ED Provider Notes (Signed)
[facility]    CSN: [id number] Arrival date & time: 10/04/22  1356      History   Chief Complaint Chief Complaint  Patient presents with   Nasal Congestion    Feel bad. Cough. - Entered by patient   Cough   Shortness of Breath    HPI Debbie Powers is a 61 y.o. female who presents with body aches, productive cough of gray color mucous and SOB x 5 days. Has been wheezing more and been using her neb machine 3 times a day. She started with low grade temp, L ST and cough and she tried to go to work today, she felt worse today when she went to work, and decided to leave. Her body aches are worse today. She denies being sick at all this month. Denies GI or GU symptoms. Has been having a lot of sinus pressure.     Past Medical History:  Diagnosis Date   Asthma    Bilateral carpal tunnel syndrome    Degenerative disc disease, cervical    Degenerative disc disease, lumbar    Depression    Diabetes mellitus type 2 with retinopathy (HCC)    GERD (gastroesophageal reflux disease)    History of herpes genitalis    Hyperlipidemia    Hypertension    Obesity    Osteoarthritis of both knees    Seasonal rhinitis    Wears dentures     Patient Active Problem List   Diagnosis Date Noted   Cervical radiculitis 02/05/2020   Foraminal stenosis of cervical region 02/05/2020   Neck pain 02/05/2020   COPD, mild ([place]) 02/26/2018   Severe obesity (BMI 35.0-39.9) with comorbidity ([place]) 06/27/2017   Right Achilles tendinitis 06/16/2017   Greater trochanteric bursitis of both hips 03/01/2017   Rotator cuff syndrome, right 03/01/2017   Postmenopausal 08/12/2016   Bilateral carpal tunnel syndrome 12/04/2014   Lumbar degenerative disc disease 08/23/2014   Insomnia 08/06/2014   Osteoarthritis of both knees 08/06/2014   Diabetes mellitus type 2 with retinopathy ([place]) 08/05/2014   Gastroesophageal reflux disease 08/05/2014   High risk medication use 08/05/2014    Hyperlipidemia associated with type 2 diabetes mellitus ([place]) 08/05/2014   Hypertension associated with type 2 diabetes mellitus ([place]) 08/05/2014   Recurrent major depressive disorder, in partial remission ([place]) 08/05/2014   Seasonal rhinitis 08/05/2014    Past Surgical History:  Procedure Laterality Date   CARPAL TUNNEL RELEASE Left 04/09/2016   Procedure: LEFT OPEN CARPAL TUNNEL RELEASE;  Surgeon: Leanor Kail, MD;  Location: [place];  Service: Orthopedics;  Laterality: Left;   COLONOSCOPY     COLONOSCOPY WITH PROPOFOL N/A 05/05/2022   Procedure: COLONOSCOPY WITH PROPOFOL;  Surgeon: Toledo, Benay Pike, MD;  Location: [facility] END[facility]COPY;  Service: Endoscopy;  Laterality: N/A;   ESOPHAGOGASTRODUODENOSCOPY (EGD) WITH PROPOFOL N/A 05/05/2022   Procedure: ESOPHAGOGASTRODUODENOSCOPY (EGD) WITH PROPOFOL;  Surgeon: Toledo, Benay Pike, MD;  Location: [facility] ENDOSCOPY;  Service: Endoscopy;  Laterality: N/A;   [place]   NOSE SURGERY  1994   open carpal tunnel release Right 2015    OB History   No obstetric history on file.      Home Medications    Prior to Admission medications   Medication Sig Start Date End Date Taking? Authorizing Provider  amoxicillin-clavulanate (AUGMENTIN) 875-125 MG tablet Take 1 tablet by mouth every 12 (twelve) hours. 10/04/22  Yes Rodriguez-Southworth, Sunday Spillers, PA-C  acetaminophen (TYLENOL) 325 MG tablet Take by mouth.  [provider]  albuterol (PROVENTIL) (2.5 MG/3ML) 0.083% nebulizer solution Take 2.5 mg by nebulization every 4 (four) hours as needed. Last treatment around 2 am. 06/30/21 06/30/22  [provider]  albuterol (VENTOLIN HFA) 108 (90 Base) MCG/ACT inhaler Inhale 2 puffs into the lungs every 4 (four) hours as needed. Last dose "this morning" 08/06/20   [provider]  atorvastatin (LIPITOR) 40 MG tablet Take 40 mg by mouth daily. 05/12/21   [provider]  baclofen (LIORESAL) 10 MG tablet  Take 1 tablet (10 mg total) by mouth 3 (three) times daily. 08/19/21   Rodriguez-Southworth, Sunday Spillers, PA-C  benazepril (LOTENSIN) 40 MG tablet Take 40 mg by mouth daily.    [provider]  benzonatate (TESSALON) 200 MG capsule Take by mouth. 11/02/21   [provider]  buPROPion (WELLBUTRIN XL) 150 MG 24 hr tablet Take 150 mg by mouth daily. 05/06/21   [provider]  chlorzoxazone (PARAFON) 500 MG tablet Take 500 mg by mouth 3 (three) times daily as needed. 12/15/21   [provider]  citalopram (CELEXA) 40 MG tablet Take 40 mg by mouth daily.    [provider]  diphenhydrAMINE (BENADRYL) 25 mg capsule Take by mouth.    [provider]  fluticasone (FLONASE) 50 MCG/ACT nasal spray Place into the nose. 01/29/21 01/29/22  [provider]  fluticasone-salmeterol (ADVAIR) 250-50 MCG/ACT AEPB Inhale into the lungs. 01/26/21 05/05/22  [provider]  glimepiride (AMARYL) 2 MG tablet Take 2 mg by mouth daily with breakfast.    [provider]  glucose blood (PRECISION QID TEST) test strip 1 each (1 strip total) 2 (two) times daily Use as instructed. 02/03/21   [provider]  ibuprofen (ADVIL,MOTRIN) 200 MG tablet Take 400-800 mg by mouth every 8 (eight) hours as needed.    [provider]  insulin detemir (LEVEMIR FLEXTOUCH) 100 UNIT/ML FlexPen Levemir FlexTouch U-100 Insulin 100 unit/mL (3 mL) subcutaneous pen    [provider]  Insulin Pen Needle (B-D UF III MINI PEN NEEDLES) 31G X 5 MM MISC Use once daily with insulin pen as directed. 11/06/19   [provider]  LANTUS SOLOSTAR 100 UNIT/ML Solostar Pen SMARTSIG:12 Unit(s) SUB-Q Daily 05/11/21   [provider]  metFORMIN (GLUCOPHAGE-XR) 500 MG 24 hr tablet Take 1,000 mg by mouth 2 (two) times daily with a meal.    [provider]  pantoprazole (PROTONIX) 40 MG tablet Take 1 tablet (40 mg total) by mouth 2 (two) times daily.  05/29/21   [name], DO  potassium chloride SA (K-DUR,KLOR-CON) 20 MEQ tablet Take 20 mEq by mouth 3 (three) times daily.    [provider]  SPIRIVA HANDIHALER 18 MCG inhalation capsule 1 capsule daily. 04/23/21   [provider]  sucralfate (CARAFATE) 1 g tablet Take 1 g by mouth 4 (four) times daily. 08/27/21   [provider]  traMADol (ULTRAM) 50 MG tablet tramadol 50 mg tablet    [provider]  triamterene-hydrochlorothiazide (MAXZIDE-25) 37.5-25 MG tablet Take 1 tablet by mouth daily.    [provider]  valACYclovir (VALTREX) 500 MG tablet valacyclovir 500 mg tablet    [provider]  zolpidem (AMBIEN) 10 MG tablet Take 10 mg by mouth at bedtime as needed for sleep.    [provider]  estradiol (ESTRACE) 0.5 MG tablet Take 0.5 mg by mouth daily.  10/09/19  [provider]  medroxyPROGESTERone (PROVERA) 2.5 MG tablet Take 2.5  mg by mouth daily.  10/09/19  [provider]    Family History Family History  Problem Relation Age of Onset   Heart attack Mother    Diabetes Mother    Hypertension Mother    Stroke Father    Hypertension Father    Diabetes Father    Breast cancer Neg Hx     Social History Social History   Tobacco Use   Smoking status: Former    Types: Cigarettes    Quit date: 1997    Years since quitting: 26.9   Smokeless tobacco: Never  Vaping Use   Vaping Use: Never used  Substance Use Topics   Alcohol use: No   Drug use: No     Allergies   Patient has no known allergies.   Review of Systems Review of Systems  Constitutional:  Positive for appetite change, chills, fatigue and fever. Negative for activity change.  HENT:  Positive for congestion, sinus pressure, sinus pain and sore throat. Negative for ear discharge and ear pain.   Eyes:  Negative for discharge.  Respiratory:  Positive for cough, shortness of breath and wheezing. Negative for chest tightness.    Cardiovascular:  Negative for chest pain.     Physical Exam Triage Vital Signs ED Triage Vitals  Enc Vitals Group     BP 10/04/22 1556 (!) 140/72     Pulse Rate 10/04/22 1556 (!) 105     Resp 10/04/22 1556 18     Temp 10/04/22 1556 (!) 102 F (38.9 C)     Temp Source 10/04/22 1556 Oral     SpO2 10/04/22 1556 96 %     Weight --      Height --      Head Circumference --      Peak Flow --      Pain Score 10/04/22 1555 9     Pain Loc --      Pain Edu? --      Excl. in [place]? --    No data found.  Updated Vital Signs BP (!) 140/72 (BP Location: Right Arm)   Pulse (!) 105   Temp (!) 102 F (38.9 C) (Oral)   Resp 18   SpO2 96%   Visual Acuity Right Eye Distance:   Left Eye Distance:   Bilateral Distance:    Right Eye Near:   Left Eye Near:    Bilateral Near:     Physical Exam Vitals and nursing note reviewed.  Constitutional:      Appearance: She is obese. She is ill-appearing and diaphoretic. She is not toxic-appearing.  HENT:     Right Ear: Tympanic membrane, ear canal and external ear normal.     Left Ear: Tympanic membrane, ear canal and external ear normal.     Nose:     Right Turbinates: Enlarged.     Left Turbinates: Enlarged.     Right Sinus: No maxillary sinus tenderness or frontal sinus tenderness.     Left Sinus: No maxillary sinus tenderness or frontal sinus tenderness.     Comments: + ethmoid tenderness    Mouth/Throat:     Mouth: Mucous membranes are moist.     Pharynx: Oropharynx is clear.     Comments: Her L tonsil is a little larger than the R, but not red. Uvula is mid line. No exudate present.  Eyes:     General: No scleral icterus.    Conjunctiva/sclera: Conjunctivae normal.  Cardiovascular:  Rate and Rhythm: Tachycardia present.  Pulmonary:     Effort: Pulmonary effort is normal.     Breath sounds: Wheezing present.     Comments: Has mild wheezing all over Musculoskeletal:        General: Normal range of motion.     Cervical back:  Neck supple. No tenderness.  Lymphadenopathy:     Cervical: No cervical adenopathy.  Skin:    General: Skin is warm.  Neurological:     Mental Status: She is alert and oriented to person, place, and time.  Psychiatric:        Mood and Affect: Mood normal.        Behavior: Behavior normal.        Thought Content: Thought content normal.        Judgment: Judgment normal.      UC Treatments / Results  Labs (all labs ordered are listed, but only abnormal results are displayed) Labs Reviewed  RESP PANEL BY RT-PCR (RSV, FLU A&B, COVID)  [id number]  Respiratory panel is negatiev   EKG   Radiology DG Chest 2 View  Result Date: 10/04/2022 CLINICAL DATA:  SOB, cough and fever EXAM: CHEST - 2 VIEW COMPARISON:  January 27, 2022, November 03, 2017 FINDINGS: The cardiomediastinal silhouette is unchanged in contour. No pleural effusion. No pneumothorax. No acute pleuroparenchymal abnormality. Visualized abdomen is unremarkable. Mild multilevel degenerative changes of the thoracic spine. IMPRESSION: No acute cardiopulmonary abnormality. Electronically Signed   By: Valentino Saxon M.D.   On: 10/04/2022 16:51    Procedures Procedures (including critical care time)  Medications Ordered in UC Medications  acetaminophen (TYLENOL) tablet 1,000 mg (1,000 mg Oral Given 10/04/22 1646)    Initial Impression / Assessment and Plan / UC Course  I have reviewed the triage vital signs and the nursing notes.  Pertinent labs & imaging results that were available during my care of the patient were reviewed by me and considered in my medical decision making (see chart for details).  Ethmoid sinusitis Bronchitis  I placed her on Augmentin as noted, and advised her to continue her nebs. See instructions.    Final Clinical Impressions(s) / UC Diagnoses   Final diagnoses:  Ethmoid sinusitis, unspecified chronicity  Acute bronchitis, unspecified organism     Discharge Instructions      I believe  you have a secondary infection from what ever virus you started with If you get worse in the next 2-3 days, please go to ER to have blood work and more test done.  Continue using your nebulizer every 4 hours for cough and wheezing      ED Prescriptions     Medication Sig Dispense Auth. Provider   amoxicillin-clavulanate (AUGMENTIN) 875-125 MG tablet Take 1 tablet by mouth every 12 (twelve) hours. 14 tablet Rodriguez-Southworth, Sunday Spillers, PA-C      PDMP not reviewed this encounter.   Shelby Mattocks, PA-C 10/04/22 1717

## 2022-10-04 NOTE — ED Triage Notes (Signed)
Pt presents with a productive cough, SOB, chest congestion and bodyaches x 5 days.

## 2022-10-05 DIAGNOSIS — G4733 Obstructive sleep apnea (adult) (pediatric): Secondary | ICD-10-CM | POA: Diagnosis not present

## 2022-11-16 ENCOUNTER — Other Ambulatory Visit: Payer: Self-pay | Admitting: Nurse Practitioner

## 2022-11-16 DIAGNOSIS — E1169 Type 2 diabetes mellitus with other specified complication: Secondary | ICD-10-CM | POA: Diagnosis not present

## 2022-11-16 DIAGNOSIS — K219 Gastro-esophageal reflux disease without esophagitis: Secondary | ICD-10-CM | POA: Diagnosis not present

## 2022-11-16 DIAGNOSIS — R69 Illness, unspecified: Secondary | ICD-10-CM | POA: Diagnosis not present

## 2022-11-16 DIAGNOSIS — G4733 Obstructive sleep apnea (adult) (pediatric): Secondary | ICD-10-CM | POA: Diagnosis not present

## 2022-11-16 DIAGNOSIS — Z0000 Encounter for general adult medical examination without abnormal findings: Secondary | ICD-10-CM | POA: Diagnosis not present

## 2022-11-16 DIAGNOSIS — D509 Iron deficiency anemia, unspecified: Secondary | ICD-10-CM | POA: Diagnosis not present

## 2022-11-16 DIAGNOSIS — E1159 Type 2 diabetes mellitus with other circulatory complications: Secondary | ICD-10-CM | POA: Diagnosis not present

## 2022-11-16 DIAGNOSIS — E11319 Type 2 diabetes mellitus with unspecified diabetic retinopathy without macular edema: Secondary | ICD-10-CM | POA: Diagnosis not present

## 2022-11-16 DIAGNOSIS — J449 Chronic obstructive pulmonary disease, unspecified: Secondary | ICD-10-CM | POA: Diagnosis not present

## 2022-11-16 DIAGNOSIS — Z1231 Encounter for screening mammogram for malignant neoplasm of breast: Secondary | ICD-10-CM

## 2022-11-16 DIAGNOSIS — Z79899 Other long term (current) drug therapy: Secondary | ICD-10-CM | POA: Diagnosis not present

## 2022-11-18 DIAGNOSIS — M25811 Other specified joint disorders, right shoulder: Secondary | ICD-10-CM | POA: Diagnosis not present

## 2022-11-18 DIAGNOSIS — M25561 Pain in right knee: Secondary | ICD-10-CM | POA: Diagnosis not present

## 2022-11-18 DIAGNOSIS — E11319 Type 2 diabetes mellitus with unspecified diabetic retinopathy without macular edema: Secondary | ICD-10-CM | POA: Diagnosis not present

## 2022-11-18 DIAGNOSIS — M25511 Pain in right shoulder: Secondary | ICD-10-CM | POA: Diagnosis not present

## 2022-11-18 DIAGNOSIS — Z794 Long term (current) use of insulin: Secondary | ICD-10-CM | POA: Diagnosis not present

## 2022-11-18 DIAGNOSIS — M1711 Unilateral primary osteoarthritis, right knee: Secondary | ICD-10-CM | POA: Diagnosis not present

## 2022-11-25 DIAGNOSIS — M5441 Lumbago with sciatica, right side: Secondary | ICD-10-CM | POA: Diagnosis not present

## 2022-11-25 DIAGNOSIS — M542 Cervicalgia: Secondary | ICD-10-CM | POA: Diagnosis not present

## 2022-11-25 DIAGNOSIS — M5442 Lumbago with sciatica, left side: Secondary | ICD-10-CM | POA: Diagnosis not present

## 2022-11-25 DIAGNOSIS — M9931 Osseous stenosis of neural canal of cervical region: Secondary | ICD-10-CM | POA: Diagnosis not present

## 2022-11-25 DIAGNOSIS — M5412 Radiculopathy, cervical region: Secondary | ICD-10-CM | POA: Diagnosis not present

## 2022-12-02 DIAGNOSIS — E119 Type 2 diabetes mellitus without complications: Secondary | ICD-10-CM | POA: Diagnosis not present

## 2022-12-02 DIAGNOSIS — M5412 Radiculopathy, cervical region: Secondary | ICD-10-CM | POA: Diagnosis not present

## 2022-12-02 DIAGNOSIS — M9931 Osseous stenosis of neural canal of cervical region: Secondary | ICD-10-CM | POA: Diagnosis not present

## 2022-12-07 DIAGNOSIS — K219 Gastro-esophageal reflux disease without esophagitis: Secondary | ICD-10-CM | POA: Diagnosis not present

## 2022-12-07 DIAGNOSIS — R32 Unspecified urinary incontinence: Secondary | ICD-10-CM | POA: Diagnosis not present

## 2022-12-07 DIAGNOSIS — Z823 Family history of stroke: Secondary | ICD-10-CM | POA: Diagnosis not present

## 2022-12-07 DIAGNOSIS — E1142 Type 2 diabetes mellitus with diabetic polyneuropathy: Secondary | ICD-10-CM | POA: Diagnosis not present

## 2022-12-07 DIAGNOSIS — E785 Hyperlipidemia, unspecified: Secondary | ICD-10-CM | POA: Diagnosis not present

## 2022-12-07 DIAGNOSIS — Z87891 Personal history of nicotine dependence: Secondary | ICD-10-CM | POA: Diagnosis not present

## 2022-12-07 DIAGNOSIS — Z8249 Family history of ischemic heart disease and other diseases of the circulatory system: Secondary | ICD-10-CM | POA: Diagnosis not present

## 2022-12-07 DIAGNOSIS — Z833 Family history of diabetes mellitus: Secondary | ICD-10-CM | POA: Diagnosis not present

## 2022-12-07 DIAGNOSIS — K5900 Constipation, unspecified: Secondary | ICD-10-CM | POA: Diagnosis not present

## 2022-12-07 DIAGNOSIS — I10 Essential (primary) hypertension: Secondary | ICD-10-CM | POA: Diagnosis not present

## 2022-12-07 DIAGNOSIS — Z794 Long term (current) use of insulin: Secondary | ICD-10-CM | POA: Diagnosis not present

## 2022-12-07 DIAGNOSIS — Z7984 Long term (current) use of oral hypoglycemic drugs: Secondary | ICD-10-CM | POA: Diagnosis not present

## 2022-12-17 DIAGNOSIS — M9931 Osseous stenosis of neural canal of cervical region: Secondary | ICD-10-CM | POA: Diagnosis not present

## 2022-12-17 DIAGNOSIS — G8929 Other chronic pain: Secondary | ICD-10-CM | POA: Diagnosis not present

## 2022-12-17 DIAGNOSIS — M5442 Lumbago with sciatica, left side: Secondary | ICD-10-CM | POA: Diagnosis not present

## 2022-12-17 DIAGNOSIS — M5441 Lumbago with sciatica, right side: Secondary | ICD-10-CM | POA: Diagnosis not present

## 2022-12-17 DIAGNOSIS — M25511 Pain in right shoulder: Secondary | ICD-10-CM | POA: Diagnosis not present

## 2022-12-17 DIAGNOSIS — M542 Cervicalgia: Secondary | ICD-10-CM | POA: Diagnosis not present

## 2022-12-17 DIAGNOSIS — M5412 Radiculopathy, cervical region: Secondary | ICD-10-CM | POA: Diagnosis not present

## 2023-01-07 DIAGNOSIS — G4733 Obstructive sleep apnea (adult) (pediatric): Secondary | ICD-10-CM | POA: Diagnosis not present

## 2023-01-25 DIAGNOSIS — J449 Chronic obstructive pulmonary disease, unspecified: Secondary | ICD-10-CM | POA: Diagnosis not present

## 2023-01-25 DIAGNOSIS — G4733 Obstructive sleep apnea (adult) (pediatric): Secondary | ICD-10-CM | POA: Diagnosis not present

## 2023-02-16 DIAGNOSIS — E1159 Type 2 diabetes mellitus with other circulatory complications: Secondary | ICD-10-CM | POA: Diagnosis not present

## 2023-02-16 DIAGNOSIS — Z794 Long term (current) use of insulin: Secondary | ICD-10-CM | POA: Diagnosis not present

## 2023-02-16 DIAGNOSIS — E1169 Type 2 diabetes mellitus with other specified complication: Secondary | ICD-10-CM | POA: Diagnosis not present

## 2023-02-16 DIAGNOSIS — E11319 Type 2 diabetes mellitus with unspecified diabetic retinopathy without macular edema: Secondary | ICD-10-CM | POA: Diagnosis not present

## 2023-02-16 DIAGNOSIS — F5104 Psychophysiologic insomnia: Secondary | ICD-10-CM | POA: Diagnosis not present

## 2023-02-16 DIAGNOSIS — F3341 Major depressive disorder, recurrent, in partial remission: Secondary | ICD-10-CM | POA: Diagnosis not present

## 2023-02-16 DIAGNOSIS — K219 Gastro-esophageal reflux disease without esophagitis: Secondary | ICD-10-CM | POA: Diagnosis not present

## 2023-02-16 DIAGNOSIS — Z79899 Other long term (current) drug therapy: Secondary | ICD-10-CM | POA: Diagnosis not present

## 2023-02-16 DIAGNOSIS — G4733 Obstructive sleep apnea (adult) (pediatric): Secondary | ICD-10-CM | POA: Diagnosis not present

## 2023-02-16 DIAGNOSIS — J449 Chronic obstructive pulmonary disease, unspecified: Secondary | ICD-10-CM | POA: Diagnosis not present

## 2023-02-16 DIAGNOSIS — E785 Hyperlipidemia, unspecified: Secondary | ICD-10-CM | POA: Diagnosis not present

## 2023-02-24 DIAGNOSIS — E11319 Type 2 diabetes mellitus with unspecified diabetic retinopathy without macular edema: Secondary | ICD-10-CM | POA: Diagnosis not present

## 2023-02-24 DIAGNOSIS — M1712 Unilateral primary osteoarthritis, left knee: Secondary | ICD-10-CM | POA: Diagnosis not present

## 2023-02-24 DIAGNOSIS — Z794 Long term (current) use of insulin: Secondary | ICD-10-CM | POA: Diagnosis not present

## 2023-02-24 DIAGNOSIS — Z6841 Body Mass Index (BMI) 40.0 and over, adult: Secondary | ICD-10-CM | POA: Diagnosis not present

## 2023-02-28 ENCOUNTER — Ambulatory Visit
Admission: RE | Admit: 2023-02-28 | Discharge: 2023-02-28 | Disposition: A | Discharge status: Home / Self Care | Payer: 59 | Source: Ambulatory Visit | Attending: Nurse Practitioner | Admitting: Nurse Practitioner

## 2023-02-28 DIAGNOSIS — Z1231 Encounter for screening mammogram for malignant neoplasm of breast: Secondary | ICD-10-CM | POA: Diagnosis not present

## 2023-04-05 ENCOUNTER — Other Ambulatory Visit: Payer: Self-pay | Admitting: Orthopedic Surgery

## 2023-04-05 DIAGNOSIS — M1712 Unilateral primary osteoarthritis, left knee: Secondary | ICD-10-CM

## 2023-04-05 DIAGNOSIS — M545 Low back pain, unspecified: Secondary | ICD-10-CM | POA: Diagnosis not present

## 2023-04-05 DIAGNOSIS — M5432 Sciatica, left side: Secondary | ICD-10-CM | POA: Diagnosis not present

## 2023-04-05 DIAGNOSIS — Z794 Long term (current) use of insulin: Secondary | ICD-10-CM | POA: Diagnosis not present

## 2023-04-05 DIAGNOSIS — Z6841 Body Mass Index (BMI) 40.0 and over, adult: Secondary | ICD-10-CM | POA: Diagnosis not present

## 2023-04-05 DIAGNOSIS — E11319 Type 2 diabetes mellitus with unspecified diabetic retinopathy without macular edema: Secondary | ICD-10-CM | POA: Diagnosis not present

## 2023-04-05 DIAGNOSIS — M2392 Unspecified internal derangement of left knee: Secondary | ICD-10-CM | POA: Diagnosis not present

## 2023-04-05 DIAGNOSIS — S39012A Strain of muscle, fascia and tendon of lower back, initial encounter: Secondary | ICD-10-CM | POA: Diagnosis not present

## 2023-04-07 DIAGNOSIS — G4733 Obstructive sleep apnea (adult) (pediatric): Secondary | ICD-10-CM | POA: Diagnosis not present

## 2023-04-27 ENCOUNTER — Other Ambulatory Visit: Payer: 59

## 2023-05-07 DIAGNOSIS — G4733 Obstructive sleep apnea (adult) (pediatric): Secondary | ICD-10-CM | POA: Diagnosis not present

## 2023-05-13 ENCOUNTER — Ambulatory Visit
Admission: RE | Admit: 2023-05-13 | Discharge: 2023-05-13 | Disposition: A | Discharge status: Home / Self Care | Payer: 59 | Source: Ambulatory Visit | Attending: Orthopedic Surgery | Admitting: Orthopedic Surgery

## 2023-05-13 ENCOUNTER — Other Ambulatory Visit: Payer: 59

## 2023-05-13 DIAGNOSIS — M1712 Unilateral primary osteoarthritis, left knee: Secondary | ICD-10-CM

## 2023-05-13 DIAGNOSIS — M2392 Unspecified internal derangement of left knee: Secondary | ICD-10-CM

## 2023-05-13 DIAGNOSIS — E119 Type 2 diabetes mellitus without complications: Secondary | ICD-10-CM | POA: Diagnosis not present

## 2023-05-13 DIAGNOSIS — M25562 Pain in left knee: Secondary | ICD-10-CM | POA: Diagnosis not present

## 2023-05-13 DIAGNOSIS — M23332 Other meniscus derangements, other medial meniscus, left knee: Secondary | ICD-10-CM | POA: Diagnosis not present

## 2023-05-13 DIAGNOSIS — M25462 Effusion, left knee: Secondary | ICD-10-CM | POA: Diagnosis not present

## 2023-05-23 DIAGNOSIS — F3341 Major depressive disorder, recurrent, in partial remission: Secondary | ICD-10-CM | POA: Diagnosis not present

## 2023-05-23 DIAGNOSIS — F5104 Psychophysiologic insomnia: Secondary | ICD-10-CM | POA: Diagnosis not present

## 2023-05-23 DIAGNOSIS — K219 Gastro-esophageal reflux disease without esophagitis: Secondary | ICD-10-CM | POA: Diagnosis not present

## 2023-05-23 DIAGNOSIS — E1169 Type 2 diabetes mellitus with other specified complication: Secondary | ICD-10-CM | POA: Diagnosis not present

## 2023-05-23 DIAGNOSIS — G4733 Obstructive sleep apnea (adult) (pediatric): Secondary | ICD-10-CM | POA: Diagnosis not present

## 2023-05-23 DIAGNOSIS — E11319 Type 2 diabetes mellitus with unspecified diabetic retinopathy without macular edema: Secondary | ICD-10-CM | POA: Diagnosis not present

## 2023-05-23 DIAGNOSIS — Z794 Long term (current) use of insulin: Secondary | ICD-10-CM | POA: Diagnosis not present

## 2023-05-23 DIAGNOSIS — E1159 Type 2 diabetes mellitus with other circulatory complications: Secondary | ICD-10-CM | POA: Diagnosis not present

## 2023-05-23 DIAGNOSIS — R0789 Other chest pain: Secondary | ICD-10-CM | POA: Diagnosis not present

## 2023-05-23 DIAGNOSIS — D509 Iron deficiency anemia, unspecified: Secondary | ICD-10-CM | POA: Diagnosis not present

## 2023-05-23 DIAGNOSIS — I152 Hypertension secondary to endocrine disorders: Secondary | ICD-10-CM | POA: Diagnosis not present

## 2023-05-23 DIAGNOSIS — E785 Hyperlipidemia, unspecified: Secondary | ICD-10-CM | POA: Diagnosis not present

## 2023-05-23 DIAGNOSIS — Z79899 Other long term (current) drug therapy: Secondary | ICD-10-CM | POA: Diagnosis not present

## 2023-05-23 DIAGNOSIS — J449 Chronic obstructive pulmonary disease, unspecified: Secondary | ICD-10-CM | POA: Diagnosis not present

## 2023-05-24 DIAGNOSIS — G4733 Obstructive sleep apnea (adult) (pediatric): Secondary | ICD-10-CM | POA: Diagnosis not present

## 2023-05-24 DIAGNOSIS — J449 Chronic obstructive pulmonary disease, unspecified: Secondary | ICD-10-CM | POA: Diagnosis not present

## 2023-05-24 DIAGNOSIS — J301 Allergic rhinitis due to pollen: Secondary | ICD-10-CM | POA: Diagnosis not present

## 2023-05-26 DIAGNOSIS — R079 Chest pain, unspecified: Secondary | ICD-10-CM | POA: Diagnosis not present

## 2023-05-26 DIAGNOSIS — R0602 Shortness of breath: Secondary | ICD-10-CM | POA: Diagnosis not present

## 2023-05-26 DIAGNOSIS — I10 Essential (primary) hypertension: Secondary | ICD-10-CM | POA: Diagnosis not present

## 2023-06-07 DIAGNOSIS — G4733 Obstructive sleep apnea (adult) (pediatric): Secondary | ICD-10-CM | POA: Diagnosis not present

## 2023-06-14 DIAGNOSIS — R079 Chest pain, unspecified: Secondary | ICD-10-CM | POA: Diagnosis not present

## 2023-06-14 DIAGNOSIS — R0602 Shortness of breath: Secondary | ICD-10-CM | POA: Diagnosis not present

## 2023-07-06 DIAGNOSIS — G4733 Obstructive sleep apnea (adult) (pediatric): Secondary | ICD-10-CM | POA: Diagnosis not present

## 2023-07-07 DIAGNOSIS — R079 Chest pain, unspecified: Secondary | ICD-10-CM | POA: Diagnosis not present

## 2023-07-07 DIAGNOSIS — R9439 Abnormal result of other cardiovascular function study: Secondary | ICD-10-CM | POA: Diagnosis not present

## 2023-07-07 DIAGNOSIS — I10 Essential (primary) hypertension: Secondary | ICD-10-CM | POA: Diagnosis not present

## 2023-07-07 DIAGNOSIS — R0602 Shortness of breath: Secondary | ICD-10-CM | POA: Diagnosis not present

## 2023-07-14 ENCOUNTER — Ambulatory Visit
Admission: RE | Admit: 2023-07-14 | Discharge: 2023-07-14 | Disposition: A | Discharge status: Home / Self Care | Payer: 59 | Attending: Internal Medicine | Admitting: Internal Medicine

## 2023-07-14 ENCOUNTER — Encounter: Payer: Self-pay | Admitting: Internal Medicine

## 2023-07-14 ENCOUNTER — Other Ambulatory Visit: Payer: Self-pay

## 2023-07-14 ENCOUNTER — Encounter
Admission: RE | Disposition: A | Discharge status: Home / Self Care | Payer: Self-pay | Source: Home / Self Care | Attending: Internal Medicine

## 2023-07-14 DIAGNOSIS — R9430 Abnormal result of cardiovascular function study, unspecified: Secondary | ICD-10-CM | POA: Diagnosis not present

## 2023-07-14 HISTORY — PX: LEFT HEART CATH AND CORONARY ANGIOGRAPHY: CATH118249

## 2023-07-14 LAB — CARDIAC CATHETERIZATION: Cath EF Quantitative: 60 %

## 2023-07-14 LAB — GLUCOSE, CAPILLARY: Glucose-Capillary: 109 mg/dL — ABNORMAL HIGH (ref 70–99)

## 2023-07-14 SURGERY — LEFT HEART CATH AND CORONARY ANGIOGRAPHY
Anesthesia: Moderate Sedation | Laterality: Left

## 2023-07-14 MED ORDER — HEPARIN SODIUM (PORCINE) 1000 UNIT/ML IJ SOLN
INTRAMUSCULAR | Status: AC
  Filled 2023-07-14: qty 10

## 2023-07-14 MED ORDER — MIDAZOLAM HCL 2 MG/2ML IJ SOLN
INTRAMUSCULAR | Status: DC | PRN
  Administered 2023-07-14: 1 mg via INTRAVENOUS

## 2023-07-14 MED ORDER — FENTANYL CITRATE (PF) 100 MCG/2ML IJ SOLN
INTRAMUSCULAR | Status: AC
  Filled 2023-07-14: qty 2

## 2023-07-14 MED ORDER — HEPARIN SODIUM (PORCINE) 1000 UNIT/ML IJ SOLN
INTRAMUSCULAR | Status: DC | PRN
  Administered 2023-07-14: 5000 [IU] via INTRAVENOUS

## 2023-07-14 MED ORDER — MIDAZOLAM HCL 2 MG/2ML IJ SOLN
INTRAMUSCULAR | Status: AC
  Filled 2023-07-14: qty 2

## 2023-07-14 MED ORDER — VERAPAMIL HCL 2.5 MG/ML IV SOLN
INTRAVENOUS | Status: AC
  Filled 2023-07-14: qty 2

## 2023-07-14 MED ORDER — SODIUM CHLORIDE 0.9 % WEIGHT BASED INFUSION: 1.0000 mL/kg/h | INTRAVENOUS | Status: DC

## 2023-07-14 MED ORDER — SODIUM CHLORIDE 0.9% FLUSH: 3.0000 mL | Freq: Two times a day (BID) | INTRAVENOUS | Status: DC

## 2023-07-14 MED ORDER — HYDRALAZINE HCL 20 MG/ML IJ SOLN: 10.0000 mg | INTRAMUSCULAR | Status: DC | PRN

## 2023-07-14 MED ORDER — IOHEXOL 300 MG/ML  SOLN
INTRAMUSCULAR | Status: DC | PRN
  Administered 2023-07-14: 116 mL

## 2023-07-14 MED ORDER — SODIUM CHLORIDE 0.9% FLUSH: 3.0000 mL | INTRAVENOUS | Status: DC | PRN

## 2023-07-14 MED ORDER — SODIUM CHLORIDE 0.9 % WEIGHT BASED INFUSION
3.0000 mL/kg/h | INTRAVENOUS | Status: AC
  Administered 2023-07-14: 3 mL/kg/h via INTRAVENOUS

## 2023-07-14 MED ORDER — HEPARIN (PORCINE) IN NACL 1000-0.9 UT/500ML-% IV SOLN
INTRAVENOUS | Status: DC | PRN
  Administered 2023-07-14 (×2): 500 mL

## 2023-07-14 MED ORDER — ASPIRIN 81 MG PO CHEW: 81.0000 mg | CHEWABLE_TABLET | ORAL | Status: DC

## 2023-07-14 MED ORDER — VERAPAMIL HCL 2.5 MG/ML IV SOLN
INTRAVENOUS | Status: DC | PRN
  Administered 2023-07-14: 2.5 mg via INTRA_ARTERIAL

## 2023-07-14 MED ORDER — HEPARIN (PORCINE) IN NACL 1000-0.9 UT/500ML-% IV SOLN
INTRAVENOUS | Status: AC
  Filled 2023-07-14: qty 1000

## 2023-07-14 MED ORDER — SODIUM CHLORIDE 0.9 % IV SOLN: 250.0000 mL | INTRAVENOUS | Status: DC | PRN

## 2023-07-14 MED ORDER — FENTANYL CITRATE (PF) 100 MCG/2ML IJ SOLN
INTRAMUSCULAR | Status: DC | PRN
  Administered 2023-07-14: 25 ug via INTRAVENOUS

## 2023-07-14 MED ORDER — ACETAMINOPHEN 325 MG PO TABS: 650.0000 mg | ORAL_TABLET | ORAL | Status: DC | PRN

## 2023-07-14 MED ORDER — ONDANSETRON HCL 4 MG/2ML IJ SOLN: 4.0000 mg | Freq: Four times a day (QID) | INTRAMUSCULAR | Status: DC | PRN

## 2023-07-14 MED ORDER — LIDOCAINE HCL (PF) 1 % IJ SOLN
INTRAMUSCULAR | Status: DC | PRN
  Administered 2023-07-14: 15 mL
  Administered 2023-07-14: 2 mL

## 2023-07-14 MED ORDER — LABETALOL HCL 5 MG/ML IV SOLN: 10.0000 mg | INTRAVENOUS | Status: DC | PRN

## 2023-07-14 SURGICAL SUPPLY — 17 items
CATH 5FR JL3.5 JR4 ANG PIG MP (CATHETERS) IMPLANT
DEVICE CLOSURE MYNXGRIP 5F (Vascular Products) IMPLANT
DEVICE RAD TR BAND REGULAR (VASCULAR PRODUCTS) IMPLANT
DRAPE BRACHIAL (DRAPES) IMPLANT
GLIDESHEATH SLEND SS 6F .021 (SHEATH) IMPLANT
GUIDEWIRE INQWIRE 1.5J.035X260 (WIRE) IMPLANT
INQWIRE 1.5J .035X260CM (WIRE) ×1
NDL PERC 18GX7CM (NEEDLE) IMPLANT
NEEDLE PERC 18GX7CM (NEEDLE) ×1 IMPLANT
PACK CARDIAC CATH (CUSTOM PROCEDURE TRAY) ×1 IMPLANT
PANNUS RETENTION SYSTEM 2 PAD (MISCELLANEOUS) IMPLANT
PROTECTION STATION PRESSURIZED (MISCELLANEOUS) ×1
SET ATX-X65L (MISCELLANEOUS) IMPLANT
SHEATH 6FR 75 DEST SLENDER (SHEATH) IMPLANT
SHEATH AVANTI 5FR X 11CM (SHEATH) IMPLANT
STATION PROTECTION PRESSURIZED (MISCELLANEOUS) IMPLANT
WIRE EMERALD 3MM-J .035X150CM (WIRE) IMPLANT

## 2023-07-15 ENCOUNTER — Encounter: Payer: Self-pay | Admitting: Internal Medicine

## 2023-08-05 DIAGNOSIS — G4733 Obstructive sleep apnea (adult) (pediatric): Secondary | ICD-10-CM | POA: Diagnosis not present

## 2023-08-25 DIAGNOSIS — E11319 Type 2 diabetes mellitus with unspecified diabetic retinopathy without macular edema: Secondary | ICD-10-CM | POA: Diagnosis not present

## 2023-08-25 DIAGNOSIS — K219 Gastro-esophageal reflux disease without esophagitis: Secondary | ICD-10-CM | POA: Diagnosis not present

## 2023-08-25 DIAGNOSIS — E1169 Type 2 diabetes mellitus with other specified complication: Secondary | ICD-10-CM | POA: Diagnosis not present

## 2023-08-25 DIAGNOSIS — Z23 Encounter for immunization: Secondary | ICD-10-CM | POA: Diagnosis not present

## 2023-08-25 DIAGNOSIS — Z79899 Other long term (current) drug therapy: Secondary | ICD-10-CM | POA: Diagnosis not present

## 2023-08-25 DIAGNOSIS — Z794 Long term (current) use of insulin: Secondary | ICD-10-CM | POA: Diagnosis not present

## 2023-08-25 DIAGNOSIS — G4733 Obstructive sleep apnea (adult) (pediatric): Secondary | ICD-10-CM | POA: Diagnosis not present

## 2023-08-25 DIAGNOSIS — J449 Chronic obstructive pulmonary disease, unspecified: Secondary | ICD-10-CM | POA: Diagnosis not present

## 2023-08-25 DIAGNOSIS — F5104 Psychophysiologic insomnia: Secondary | ICD-10-CM | POA: Diagnosis not present

## 2023-08-25 DIAGNOSIS — E1159 Type 2 diabetes mellitus with other circulatory complications: Secondary | ICD-10-CM | POA: Diagnosis not present

## 2023-08-25 DIAGNOSIS — F3341 Major depressive disorder, recurrent, in partial remission: Secondary | ICD-10-CM | POA: Diagnosis not present

## 2023-09-05 DIAGNOSIS — G4733 Obstructive sleep apnea (adult) (pediatric): Secondary | ICD-10-CM | POA: Diagnosis not present

## 2023-09-21 DIAGNOSIS — J301 Allergic rhinitis due to pollen: Secondary | ICD-10-CM | POA: Diagnosis not present

## 2023-09-21 DIAGNOSIS — G4733 Obstructive sleep apnea (adult) (pediatric): Secondary | ICD-10-CM | POA: Diagnosis not present

## 2023-09-21 DIAGNOSIS — J449 Chronic obstructive pulmonary disease, unspecified: Secondary | ICD-10-CM | POA: Diagnosis not present

## 2024-01-18 DIAGNOSIS — J301 Allergic rhinitis due to pollen: Secondary | ICD-10-CM | POA: Diagnosis not present

## 2024-01-18 DIAGNOSIS — G4733 Obstructive sleep apnea (adult) (pediatric): Secondary | ICD-10-CM | POA: Diagnosis not present

## 2024-01-18 DIAGNOSIS — J449 Chronic obstructive pulmonary disease, unspecified: Secondary | ICD-10-CM | POA: Diagnosis not present

## 2024-01-27 ENCOUNTER — Other Ambulatory Visit: Payer: Self-pay | Admitting: Nurse Practitioner

## 2024-01-27 DIAGNOSIS — Z79899 Other long term (current) drug therapy: Secondary | ICD-10-CM | POA: Diagnosis not present

## 2024-01-27 DIAGNOSIS — K219 Gastro-esophageal reflux disease without esophagitis: Secondary | ICD-10-CM | POA: Diagnosis not present

## 2024-01-27 DIAGNOSIS — Z124 Encounter for screening for malignant neoplasm of cervix: Secondary | ICD-10-CM | POA: Diagnosis not present

## 2024-01-27 DIAGNOSIS — E1169 Type 2 diabetes mellitus with other specified complication: Secondary | ICD-10-CM | POA: Diagnosis not present

## 2024-01-27 DIAGNOSIS — F3341 Major depressive disorder, recurrent, in partial remission: Secondary | ICD-10-CM | POA: Diagnosis not present

## 2024-01-27 DIAGNOSIS — Z1231 Encounter for screening mammogram for malignant neoplasm of breast: Secondary | ICD-10-CM | POA: Diagnosis not present

## 2024-01-27 DIAGNOSIS — E1159 Type 2 diabetes mellitus with other circulatory complications: Secondary | ICD-10-CM | POA: Diagnosis not present

## 2024-01-27 DIAGNOSIS — E11319 Type 2 diabetes mellitus with unspecified diabetic retinopathy without macular edema: Secondary | ICD-10-CM | POA: Diagnosis not present

## 2024-01-27 DIAGNOSIS — J449 Chronic obstructive pulmonary disease, unspecified: Secondary | ICD-10-CM | POA: Diagnosis not present

## 2024-01-27 DIAGNOSIS — Z0000 Encounter for general adult medical examination without abnormal findings: Secondary | ICD-10-CM | POA: Diagnosis not present

## 2024-01-27 DIAGNOSIS — F5104 Psychophysiologic insomnia: Secondary | ICD-10-CM | POA: Diagnosis not present

## 2024-01-27 DIAGNOSIS — G4733 Obstructive sleep apnea (adult) (pediatric): Secondary | ICD-10-CM | POA: Diagnosis not present

## 2024-02-16 DIAGNOSIS — G4733 Obstructive sleep apnea (adult) (pediatric): Secondary | ICD-10-CM | POA: Diagnosis not present

## 2024-03-30 DIAGNOSIS — G8929 Other chronic pain: Secondary | ICD-10-CM | POA: Diagnosis not present

## 2024-03-30 DIAGNOSIS — M25511 Pain in right shoulder: Secondary | ICD-10-CM | POA: Diagnosis not present

## 2024-03-30 DIAGNOSIS — M5416 Radiculopathy, lumbar region: Secondary | ICD-10-CM | POA: Diagnosis not present

## 2024-03-30 DIAGNOSIS — M5441 Lumbago with sciatica, right side: Secondary | ICD-10-CM | POA: Diagnosis not present

## 2024-03-30 DIAGNOSIS — M5412 Radiculopathy, cervical region: Secondary | ICD-10-CM | POA: Diagnosis not present

## 2024-04-27 DIAGNOSIS — J449 Chronic obstructive pulmonary disease, unspecified: Secondary | ICD-10-CM | POA: Diagnosis not present

## 2024-04-27 DIAGNOSIS — Z794 Long term (current) use of insulin: Secondary | ICD-10-CM | POA: Diagnosis not present

## 2024-04-27 DIAGNOSIS — Z79899 Other long term (current) drug therapy: Secondary | ICD-10-CM | POA: Diagnosis not present

## 2024-04-27 DIAGNOSIS — E1159 Type 2 diabetes mellitus with other circulatory complications: Secondary | ICD-10-CM | POA: Diagnosis not present

## 2024-04-27 DIAGNOSIS — E1169 Type 2 diabetes mellitus with other specified complication: Secondary | ICD-10-CM | POA: Diagnosis not present

## 2024-04-27 DIAGNOSIS — G4733 Obstructive sleep apnea (adult) (pediatric): Secondary | ICD-10-CM | POA: Diagnosis not present

## 2024-04-27 DIAGNOSIS — F5104 Psychophysiologic insomnia: Secondary | ICD-10-CM | POA: Diagnosis not present

## 2024-04-27 DIAGNOSIS — E11319 Type 2 diabetes mellitus with unspecified diabetic retinopathy without macular edema: Secondary | ICD-10-CM | POA: Diagnosis not present

## 2024-04-27 DIAGNOSIS — F3341 Major depressive disorder, recurrent, in partial remission: Secondary | ICD-10-CM | POA: Diagnosis not present

## 2024-04-27 DIAGNOSIS — K219 Gastro-esophageal reflux disease without esophagitis: Secondary | ICD-10-CM | POA: Diagnosis not present

## 2024-04-27 DIAGNOSIS — M65352 Trigger finger, left little finger: Secondary | ICD-10-CM | POA: Diagnosis not present

## 2024-04-30 DIAGNOSIS — I152 Hypertension secondary to endocrine disorders: Secondary | ICD-10-CM | POA: Diagnosis not present

## 2024-04-30 DIAGNOSIS — E11319 Type 2 diabetes mellitus with unspecified diabetic retinopathy without macular edema: Secondary | ICD-10-CM | POA: Diagnosis not present

## 2024-04-30 DIAGNOSIS — E1169 Type 2 diabetes mellitus with other specified complication: Secondary | ICD-10-CM | POA: Diagnosis not present

## 2024-04-30 DIAGNOSIS — E1159 Type 2 diabetes mellitus with other circulatory complications: Secondary | ICD-10-CM | POA: Diagnosis not present

## 2024-04-30 DIAGNOSIS — Z79899 Other long term (current) drug therapy: Secondary | ICD-10-CM | POA: Diagnosis not present

## 2024-04-30 DIAGNOSIS — Z794 Long term (current) use of insulin: Secondary | ICD-10-CM | POA: Diagnosis not present

## 2024-04-30 DIAGNOSIS — E785 Hyperlipidemia, unspecified: Secondary | ICD-10-CM | POA: Diagnosis not present

## 2024-05-04 ENCOUNTER — Other Ambulatory Visit: Payer: Self-pay | Admitting: Physical Medicine & Rehabilitation

## 2024-05-04 DIAGNOSIS — M5441 Lumbago with sciatica, right side: Secondary | ICD-10-CM | POA: Diagnosis not present

## 2024-05-04 DIAGNOSIS — M5416 Radiculopathy, lumbar region: Secondary | ICD-10-CM | POA: Diagnosis not present

## 2024-05-04 DIAGNOSIS — M25511 Pain in right shoulder: Secondary | ICD-10-CM | POA: Diagnosis not present

## 2024-05-04 DIAGNOSIS — M5412 Radiculopathy, cervical region: Secondary | ICD-10-CM | POA: Diagnosis not present

## 2024-05-04 DIAGNOSIS — G8929 Other chronic pain: Secondary | ICD-10-CM | POA: Diagnosis not present

## 2024-05-07 ENCOUNTER — Encounter: Payer: Self-pay | Admitting: Physical Medicine & Rehabilitation

## 2024-05-07 DIAGNOSIS — R399 Unspecified symptoms and signs involving the genitourinary system: Secondary | ICD-10-CM | POA: Diagnosis not present

## 2024-05-08 DIAGNOSIS — M65352 Trigger finger, left little finger: Secondary | ICD-10-CM | POA: Diagnosis not present

## 2024-05-12 ENCOUNTER — Ambulatory Visit
Admission: RE | Admit: 2024-05-12 | Discharge: 2024-05-12 | Disposition: A | Discharge status: Home / Self Care | Source: Ambulatory Visit | Attending: Physical Medicine & Rehabilitation | Admitting: Physical Medicine & Rehabilitation

## 2024-05-12 DIAGNOSIS — M5416 Radiculopathy, lumbar region: Secondary | ICD-10-CM

## 2024-05-12 DIAGNOSIS — M5127 Other intervertebral disc displacement, lumbosacral region: Secondary | ICD-10-CM | POA: Diagnosis not present

## 2024-05-12 DIAGNOSIS — M4316 Spondylolisthesis, lumbar region: Secondary | ICD-10-CM | POA: Diagnosis not present

## 2024-05-16 DIAGNOSIS — G4733 Obstructive sleep apnea (adult) (pediatric): Secondary | ICD-10-CM | POA: Diagnosis not present

## 2024-05-30 DIAGNOSIS — M5441 Lumbago with sciatica, right side: Secondary | ICD-10-CM | POA: Diagnosis not present

## 2024-05-30 DIAGNOSIS — M5442 Lumbago with sciatica, left side: Secondary | ICD-10-CM | POA: Diagnosis not present

## 2024-05-30 DIAGNOSIS — G8929 Other chronic pain: Secondary | ICD-10-CM | POA: Diagnosis not present

## 2024-05-30 DIAGNOSIS — M5412 Radiculopathy, cervical region: Secondary | ICD-10-CM | POA: Diagnosis not present

## 2024-05-30 DIAGNOSIS — M5416 Radiculopathy, lumbar region: Secondary | ICD-10-CM | POA: Diagnosis not present

## 2024-06-15 DIAGNOSIS — E11319 Type 2 diabetes mellitus with unspecified diabetic retinopathy without macular edema: Secondary | ICD-10-CM | POA: Diagnosis not present

## 2024-06-15 DIAGNOSIS — M5416 Radiculopathy, lumbar region: Secondary | ICD-10-CM | POA: Diagnosis not present

## 2024-06-15 DIAGNOSIS — Z794 Long term (current) use of insulin: Secondary | ICD-10-CM | POA: Diagnosis not present

## 2024-06-20 DIAGNOSIS — J449 Chronic obstructive pulmonary disease, unspecified: Secondary | ICD-10-CM | POA: Diagnosis not present

## 2024-06-20 DIAGNOSIS — G4733 Obstructive sleep apnea (adult) (pediatric): Secondary | ICD-10-CM | POA: Diagnosis not present

## 2024-06-20 DIAGNOSIS — J302 Other seasonal allergic rhinitis: Secondary | ICD-10-CM | POA: Diagnosis not present

## 2024-06-29 DIAGNOSIS — G8929 Other chronic pain: Secondary | ICD-10-CM | POA: Diagnosis not present

## 2024-06-29 DIAGNOSIS — M5416 Radiculopathy, lumbar region: Secondary | ICD-10-CM | POA: Diagnosis not present

## 2024-06-29 DIAGNOSIS — M5412 Radiculopathy, cervical region: Secondary | ICD-10-CM | POA: Diagnosis not present

## 2024-06-29 DIAGNOSIS — M5442 Lumbago with sciatica, left side: Secondary | ICD-10-CM | POA: Diagnosis not present

## 2024-06-29 DIAGNOSIS — M5441 Lumbago with sciatica, right side: Secondary | ICD-10-CM | POA: Diagnosis not present

## 2024-06-30 ENCOUNTER — Ambulatory Visit
Admission: EM | Admit: 2024-06-30 | Discharge: 2024-06-30 | Disposition: A | Discharge status: Home / Self Care | Attending: Physician Assistant | Admitting: Physician Assistant

## 2024-06-30 ENCOUNTER — Encounter: Payer: Self-pay | Admitting: Emergency Medicine

## 2024-06-30 DIAGNOSIS — B370 Candidal stomatitis: Secondary | ICD-10-CM

## 2024-06-30 MED ORDER — NYSTATIN 100000 UNIT/ML MT SUSP: OROMUCOSAL | 0 refills | Status: AC

## 2024-06-30 MED ORDER — LIDOCAINE VISCOUS HCL 2 % MT SOLN: 15.0000 mL | OROMUCOSAL | 0 refills | Status: AC | PRN

## 2024-06-30 NOTE — ED Provider Notes (Signed)
 [facility]    CSN: [id number] Arrival date & time: 06/30/24  0853      History   Chief Complaint Chief Complaint  Patient presents with   Oral Pain    HPI Debbie Powers is a 63 y.o. female with history of asthma, COPD, insulin-dependent type 2 diabetes, hypertension, hyperlipidemia, obesity, GERD, depression and osteoarthritis.  Patient presents today for 2-week history of pain of the tongue, specifically the right side with whitish exudates on tongue.  Patient believes she may have thrush.  Has had thrush before.  HPI  Past Medical History:  Diagnosis Date   Asthma    Bilateral carpal tunnel syndrome    Degenerative disc disease, cervical    Degenerative disc disease, lumbar    Depression    Diabetes mellitus type 2 with retinopathy (HCC)    GERD (gastroesophageal reflux disease)    History of herpes genitalis    Hyperlipidemia    Hypertension    Obesity    Osteoarthritis of both knees    Seasonal rhinitis    Wears dentures     Patient Active Problem List   Diagnosis Date Noted   Cervical radiculitis 02/05/2020   Foraminal stenosis of cervical region 02/05/2020   Neck pain 02/05/2020   COPD, mild (HCC) 02/26/2018   Severe obesity (BMI 35.0-39.9) with comorbidity (HCC) 06/27/2017   Right Achilles tendinitis 06/16/2017   Greater trochanteric bursitis of both hips 03/01/2017   Rotator cuff syndrome, right 03/01/2017   Postmenopausal 08/12/2016   Bilateral carpal tunnel syndrome 12/04/2014   Lumbar degenerative disc disease 08/23/2014   Insomnia 08/06/2014   Osteoarthritis of both knees 08/06/2014   Diabetes mellitus type 2 with retinopathy (HCC) 08/05/2014   Gastroesophageal reflux disease 08/05/2014   High risk medication use 08/05/2014   Hyperlipidemia associated with type 2 diabetes mellitus (HCC) 08/05/2014   Hypertension associated with type 2 diabetes mellitus (HCC) 08/05/2014   Recurrent major depressive disorder, in partial  remission (HCC) 08/05/2014   Seasonal rhinitis 08/05/2014    Past Surgical History:  Procedure Laterality Date   CARPAL TUNNEL RELEASE Left 04/09/2016   Procedure: LEFT OPEN CARPAL TUNNEL RELEASE;  Surgeon: Helayne Glenn, MD;  Location: [facility];  Service: Orthopedics;  Laterality: Left;   COLONOSCOPY     COLONOSCOPY WITH PROPOFOL  N/A 05/05/2022   Procedure: COLONOSCOPY WITH PROPOFOL ;  Surgeon: Toledo, Ladell POUR, MD;  Location: [facility];  Service: Endoscopy;  Laterality: N/A;   ESOPHAGOGASTRODUODENOSCOPY (EGD) WITH PROPOFOL  N/A 05/05/2022   Procedure: ESOPHAGOGASTRODUODENOSCOPY (EGD) WITH PROPOFOL ;  Surgeon: Toledo, Ladell POUR, MD;  Location: [facility];  Service: Endoscopy;  Laterality: N/A;   FLEXIBLE SIGMOIDOSCOPY  2001   LEFT HEART CATH AND CORONARY ANGIOGRAPHY Left 07/14/2023   Procedure: LEFT HEART CATH AND CORONARY ANGIOGRAPHY;  Surgeon: Florencio Cara BIRCH, MD;  Location: [facility];  Service: Cardiovascular;  Laterality: Left;   NOSE SURGERY  1994   open carpal tunnel release Right 2015    OB History   No obstetric history on file.      Home Medications    Prior to Admission medications   Medication Sig Start Date End Date Taking? Authorizing Provider  albuterol  (PROVENTIL ) (2.5 MG/3ML) 0.083% nebulizer solution Take 2.5 mg by nebulization every 4 (four) hours as needed. Last treatment around 2 am. 06/30/21 06/30/24 Yes [provider]  albuterol  (VENTOLIN  HFA) 108 (90 Base) MCG/ACT inhaler Inhale 2 puffs into the lungs every 4 (four) hours as needed. Last dose this  morning 08/06/20  Yes [provider]  aspirin  EC 81 MG tablet Take 81 mg by mouth daily. Swallow whole.   Yes [provider]  atorvastatin (LIPITOR) 40 MG tablet Take 40 mg by mouth daily. 05/12/21  Yes [provider]  benazepril (LOTENSIN) 40 MG tablet Take 40 mg by mouth daily.   Yes [provider]  buPROPion (WELLBUTRIN XL) 150 MG 24  hr tablet Take 150 mg by mouth daily. 05/06/21  Yes [provider]  citalopram (CELEXA) 40 MG tablet Take 40 mg by mouth daily.   Yes [provider]  fluticasone (FLONASE) 50 MCG/ACT nasal spray Place into the nose. 01/29/21 06/30/24 Yes [provider]  fluticasone-salmeterol (ADVAIR) 250-50 MCG/ACT AEPB Inhale into the lungs. 01/26/21 06/30/24 Yes [provider]  gabapentin (NEURONTIN) 300 MG capsule Take 300 mg by mouth daily.   Yes [provider]  glimepiride (AMARYL) 2 MG tablet Take 2 mg by mouth daily with breakfast.   Yes [provider]  insulin detemir (LEVEMIR FLEXTOUCH) 100 UNIT/ML FlexPen Levemir FlexTouch U-100 Insulin 100 unit/mL (3 mL) subcutaneous pen   Yes [provider]  LANTUS SOLOSTAR 100 UNIT/ML Solostar Pen SMARTSIG:12 Unit(s) SUB-Q Daily 05/11/21  Yes [provider]  levocetirizine (XYZAL) 5 MG tablet Take 5 mg by mouth every evening.   Yes [provider]  lidocaine  (XYLOCAINE ) 2 % solution Use as directed 15 mLs in the mouth or throat every 3 (three) hours as needed for mouth pain (swish and spit). 06/30/24  Yes Arvis Jolan NOVAK, PA-C  metFORMIN (GLUCOPHAGE-XR) 500 MG 24 hr tablet Take 1,000 mg by mouth 2 (two) times daily with a meal.   Yes [provider]  metoprolol succinate (TOPROL-XL) 25 MG 24 hr tablet Take 25 mg by mouth daily.   Yes [provider]  nystatin  (MYCOSTATIN ) 100000 UNIT/ML suspension Swish and retain 5 ml in mouth for a minute q6h until 48 hours after symptoms resolve 06/30/24  Yes Arvis Jolan B, PA-C  pantoprazole  (PROTONIX ) 40 MG tablet Take 1 tablet (40 mg total) by mouth 2 (two) times daily. 05/29/21  Yes Cook, Jayce G, DO  potassium chloride SA (K-DUR,KLOR-CON) 20 MEQ tablet Take 20 mEq by mouth 3 (three) times daily.   Yes [provider]  SPIRIVA HANDIHALER 18 MCG inhalation capsule 1 capsule daily. 04/23/21  Yes [provider]   tiotropium (SPIRIVA) 18 MCG inhalation capsule Place 18 mcg into inhaler and inhale daily.   Yes [provider]  triamterene-hydrochlorothiazide (MAXZIDE-25) 37.5-25 MG tablet Take 1 tablet by mouth daily.   Yes [provider]  valACYclovir (VALTREX) 500 MG tablet valacyclovir 500 mg tablet   Yes [provider]  acetaminophen  (TYLENOL ) 325 MG tablet Take by mouth.    [provider]  benzonatate  (TESSALON ) 200 MG capsule Take by mouth. 11/02/21   [provider]  chlorzoxazone (PARAFON) 500 MG tablet Take 500 mg by mouth 3 (three) times daily as needed. Patient not taking: Reported on 07/14/2023 12/15/21   [provider]  diphenhydrAMINE (BENADRYL) 25 mg capsule Take by mouth.    [provider]  glucose blood (PRECISION QID TEST) test strip 1 each (1 strip total) 2 (two) times daily Use as instructed. 02/03/21   [provider]  ibuprofen (ADVIL,MOTRIN) 200 MG tablet Take 400-800 mg by mouth every 8 (eight) hours as needed. Patient not taking: Reported on 07/14/2023    [provider]  Insulin Pen Needle (B-D  UF III MINI PEN NEEDLES) 31G X 5 MM MISC Use once daily with insulin pen as directed. 11/06/19   [provider]  sucralfate (CARAFATE) 1 g tablet Take 1 g by mouth 4 (four) times daily. 08/27/21   [provider]  tiZANidine (ZANAFLEX) 4 MG capsule Take 4 mg by mouth 3 (three) times daily.    [provider]  traMADol (ULTRAM) 50 MG tablet tramadol 50 mg tablet Patient not taking: Reported on 07/14/2023    [provider]  zolpidem (AMBIEN) 10 MG tablet Take 10 mg by mouth at bedtime as needed for sleep. Patient not taking: Reported on 07/14/2023    [provider]  estradiol (ESTRACE) 0.5 MG tablet Take 0.5 mg by mouth daily.  10/09/19  [provider]  medroxyPROGESTERone (PROVERA) 2.5 MG tablet Take 2.5 mg by mouth daily.  10/09/19  [provider]     Family History Family History  Problem Relation Age of Onset   Heart attack Mother    Diabetes Mother    Hypertension Mother    Stroke Father    Hypertension Father    Diabetes Father    Breast cancer Neg Hx     Social History Social History   Tobacco Use   Smoking status: Former    Current packs/day: 0.00    Types: Cigarettes    Quit date: 1997    Years since quitting: 28.7   Smokeless tobacco: Never  Vaping Use   Vaping status: Never Used  Substance Use Topics   Alcohol use: No   Drug use: No     Allergies   Patient has no known allergies.   Review of Systems Review of Systems  Constitutional:  Negative for fatigue and fever.  HENT:  Negative for congestion, mouth sores, sore throat and trouble swallowing.        Tongue pain  Respiratory:  Negative for cough.      Physical Exam Triage Vital Signs ED Triage Vitals  Encounter Vitals Group     BP      Girls Systolic BP Percentile      Girls Diastolic BP Percentile      Boys Systolic BP Percentile      Boys Diastolic BP Percentile      Pulse      Resp      Temp      Temp src      SpO2      Weight      Height      Head Circumference      Peak Flow      Pain Score      Pain Loc      Pain Education      Exclude from Growth Chart    No data found.  Updated Vital Signs BP (!) 159/86 (BP Location: Left Arm)   Pulse 64   Temp 97.8 F (36.6 C) (Oral)   Resp 16   Ht 5' 5 (1.651 m)   Wt 244 lb 4.3 oz (110.8 kg)   SpO2 100%   BMI 40.65 kg/m    Physical Exam Vitals and nursing note reviewed.  Constitutional:      General: She is not in acute distress.    Appearance: Normal appearance. She is not ill-appearing or toxic-appearing.  HENT:     Head: Normocephalic and atraumatic.     Nose: Nose normal.     Mouth/Throat:     Mouth: Mucous membranes are moist.  Tongue: Lesions (whitish exudates on right lateral tongue--able to be removed with scraping) present.     Pharynx: Oropharynx  is clear. Posterior oropharyngeal erythema present.  Eyes:     General: No scleral icterus.       Right eye: No discharge.        Left eye: No discharge.     Conjunctiva/sclera: Conjunctivae normal.  Cardiovascular:     Rate and Rhythm: Normal rate and regular rhythm.     Heart sounds: Normal heart sounds.  Pulmonary:     Effort: Pulmonary effort is normal. No respiratory distress.     Breath sounds: Normal breath sounds.  Musculoskeletal:     Cervical back: Neck supple.  Skin:    General: Skin is dry.  Neurological:     General: No focal deficit present.     Mental Status: She is alert. Mental status is at baseline.     Motor: No weakness.     Gait: Gait normal.  Psychiatric:        Mood and Affect: Mood normal.        Behavior: Behavior normal.      UC Treatments / Results  Labs (all labs ordered are listed, but only abnormal results are displayed) Labs Reviewed - No data to display  EKG   Radiology No results found.  Procedures Procedures (including critical care time)  Medications Ordered in UC Medications - No data to display  Initial Impression / Assessment and Plan / UC Course  I have reviewed the triage vital signs and the nursing notes.  Pertinent labs & imaging results that were available during my care of the patient were reviewed by me and considered in my medical decision making (see chart for details).   63 year old diabetic female with history of asthma presents for 2-week history of tongue pain with whitish exudates on the right lateral tongue.  See image including chart.  Consistent with thrush.  Sent nystatin  and lidocaine  viscus solution to pharmacy.  Supportive care encouraged.  Reviewed return precautions.   Final Clinical Impressions(s) / UC Diagnoses   Final diagnoses:  Thrush   Discharge Instructions   None    ED Prescriptions     Medication Sig Dispense Auth. Provider   nystatin  (MYCOSTATIN ) 100000 UNIT/ML suspension Swish and  retain 5 ml in mouth for a minute q6h until 48 hours after symptoms resolve 473 mL Arvis Huxley B, PA-C   lidocaine  (XYLOCAINE ) 2 % solution Use as directed 15 mLs in the mouth or throat every 3 (three) hours as needed for mouth pain (swish and spit). 100 mL Arvis Huxley NOVAK, PA-C      PDMP not reviewed this encounter.   Arvis Huxley NOVAK, PA-C 06/30/24 1012

## 2024-06-30 NOTE — ED Triage Notes (Signed)
 Pt c/o mouth pain nad lesion on the right side of her tongue. She states it is painful. Started about 2 weeks ago.

## 2024-08-06 DIAGNOSIS — E785 Hyperlipidemia, unspecified: Secondary | ICD-10-CM | POA: Diagnosis not present

## 2024-08-06 DIAGNOSIS — E1169 Type 2 diabetes mellitus with other specified complication: Secondary | ICD-10-CM | POA: Diagnosis not present

## 2024-08-06 DIAGNOSIS — F3341 Major depressive disorder, recurrent, in partial remission: Secondary | ICD-10-CM | POA: Diagnosis not present

## 2024-08-06 DIAGNOSIS — Z794 Long term (current) use of insulin: Secondary | ICD-10-CM | POA: Diagnosis not present

## 2024-08-06 DIAGNOSIS — E11319 Type 2 diabetes mellitus with unspecified diabetic retinopathy without macular edema: Secondary | ICD-10-CM | POA: Diagnosis not present

## 2024-08-06 DIAGNOSIS — K219 Gastro-esophageal reflux disease without esophagitis: Secondary | ICD-10-CM | POA: Diagnosis not present

## 2024-08-06 DIAGNOSIS — E1159 Type 2 diabetes mellitus with other circulatory complications: Secondary | ICD-10-CM | POA: Diagnosis not present

## 2024-08-06 DIAGNOSIS — E669 Obesity, unspecified: Secondary | ICD-10-CM | POA: Diagnosis not present

## 2024-08-06 DIAGNOSIS — F5104 Psychophysiologic insomnia: Secondary | ICD-10-CM | POA: Diagnosis not present

## 2024-08-06 DIAGNOSIS — Z79899 Other long term (current) drug therapy: Secondary | ICD-10-CM | POA: Diagnosis not present

## 2024-08-06 DIAGNOSIS — J449 Chronic obstructive pulmonary disease, unspecified: Secondary | ICD-10-CM | POA: Diagnosis not present

## 2024-08-06 DIAGNOSIS — G4733 Obstructive sleep apnea (adult) (pediatric): Secondary | ICD-10-CM | POA: Diagnosis not present
